# Patient Record
Sex: Female | Born: 1980 | Hispanic: Yes | Marital: Married | Smoking: Never smoker
Health system: Southern US, Community
[De-identification: ages and names within clinical notes are randomized; demographics above are authoritative.]

## PROBLEM LIST (undated history)

## (undated) ENCOUNTER — Inpatient Hospital Stay: Payer: Self-pay

## (undated) DIAGNOSIS — Z789 Other specified health status: Secondary | ICD-10-CM

## (undated) HISTORY — DX: Other specified health status: Z78.9

---

## 2017-01-13 ENCOUNTER — Observation Stay
Admission: EM | Admit: 2017-01-13 | Discharge: 2017-01-14 | Disposition: A | Discharge status: Home / Self Care | Payer: Medicaid Other | Attending: Obstetrics and Gynecology | Admitting: Obstetrics and Gynecology

## 2017-01-13 ENCOUNTER — Encounter: Payer: Self-pay | Admitting: Emergency Medicine

## 2017-01-13 DIAGNOSIS — I9589 Other hypotension: Secondary | ICD-10-CM

## 2017-01-13 DIAGNOSIS — N838 Other noninflammatory disorders of ovary, fallopian tube and broad ligament: Secondary | ICD-10-CM | POA: Diagnosis not present

## 2017-01-13 DIAGNOSIS — O00102 Left tubal pregnancy without intrauterine pregnancy: Principal | ICD-10-CM | POA: Insufficient documentation

## 2017-01-13 DIAGNOSIS — K661 Hemoperitoneum: Secondary | ICD-10-CM | POA: Diagnosis not present

## 2017-01-13 DIAGNOSIS — R55 Syncope and collapse: Secondary | ICD-10-CM

## 2017-01-13 DIAGNOSIS — Z9889 Other specified postprocedural states: Secondary | ICD-10-CM

## 2017-01-13 LAB — BASIC METABOLIC PANEL
Anion gap: 9 (ref 5–15)
BUN: 11 mg/dL (ref 6–20)
CHLORIDE: 102 mmol/L (ref 101–111)
CO2: 22 mmol/L (ref 22–32)
Calcium: 8.7 mg/dL — ABNORMAL LOW (ref 8.9–10.3)
Creatinine, Ser: 0.64 mg/dL (ref 0.44–1.00)
GFR calc Af Amer: >60 mL/min (ref >60)
GFR calc non Af Amer: >60 mL/min (ref >60)
GLUCOSE: 161 mg/dL — AB (ref 65–99)
POTASSIUM: 3.7 mmol/L (ref 3.5–5.1)
Sodium: 133 mmol/L — ABNORMAL LOW (ref 135–145)

## 2017-01-13 LAB — CBC
HEMATOCRIT: 37 % (ref 35.0–47.0)
Hemoglobin: 12.2 g/dL (ref 12.0–16.0)
MCH: 29.2 pg (ref 26.0–34.0)
MCHC: 33 g/dL (ref 32.0–36.0)
MCV: 88.4 fL (ref 80.0–100.0)
Platelets: 160 10*3/uL (ref 150–440)
RBC: 4.19 MIL/uL (ref 3.80–5.20)
RDW: 13.9 % (ref 11.5–14.5)
WBC: 13.6 10*3/uL — AB (ref 3.6–11.0)

## 2017-01-13 LAB — HCG, QUANTITATIVE, PREGNANCY: hCG, Beta Chain, Quant, S: 1130 m[IU]/mL — ABNORMAL HIGH (ref <5)

## 2017-01-13 LAB — POCT PREGNANCY, URINE: Preg Test, Ur: POSITIVE — AB

## 2017-01-13 LAB — LIPASE, BLOOD: Lipase: 32 U/L (ref 11–51)

## 2017-01-13 MED ORDER — SODIUM CHLORIDE 0.9 % IV BOLUS (SEPSIS)
1000.0000 mL | Freq: Once | INTRAVENOUS | Status: AC
  Administered 2017-01-13: 1000 mL via INTRAVENOUS

## 2017-01-13 NOTE — ED Provider Notes (Signed)
[facility] Emergency Department Provider Note   ____________________________________________   First MD Initiated Contact with Patient 01/13/17 2326     (approximate)  I have reviewed the triage vital signs and the nursing notes.   HISTORY  Chief Complaint Loss of Consciousness and Abdominal Pain  History obtained via Spanish interpreter  HPI Miranda Walsh is a 36 y.o. female who presents to the ED from home with a chief complaint of abdominal pain and syncope. Patient reports lower abdominal pain approximately 7:30 PM. States she was changing clothes when she became lightheaded, dizzy with blurry vision and passed out approximately 10:30 PM. Patient's spouse caught her so she did not suffer any injuries at that time. Spouse reports loss of consciousness for 5-6 seconds. Patient reports last menstrual period over one month ago. Denies recent fever, chills, chest pain, shortness of breath, nausea, vomiting, dysuria, diarrhea. Denies recent travel trauma. Nothing makes her symptoms better or worse.   Past medical history None  Patient Active Problem List   Diagnosis Date Noted  . S/P laparoscopic surgery 01/14/2017    Past Surgical History:  Procedure Laterality Date  . NO PAST SURGERIES      Prior to Admission medications   Not on File    Allergies Patient has no known allergies.  Family History  Problem Relation Age of Onset  . Cancer Neg Hx   . Hypertension Neg Hx   . Thyroid disease Neg Hx     Social History Social History  Substance Use Topics  . Smoking status: Never Smoker  . Smokeless tobacco: Never Used  . Alcohol use No    Review of Systems  Constitutional: No fever/chills. Eyes: No visual changes. ENT: No sore throat. Cardiovascular: Denies chest pain. Respiratory: Denies shortness of breath. Gastrointestinal: Positive for abdominal pain.  No nausea, no vomiting.  No diarrhea.  No  constipation. Genitourinary: Negative for dysuria. Musculoskeletal: Negative for back pain. Skin: Negative for rash. Neurological: Negative for headaches, focal weakness or numbness.  10-point ROS otherwise negative.  ____________________________________________   PHYSICAL EXAM:  VITAL SIGNS: ED Triage Vitals  Enc Vitals Group     BP 01/13/17 2257 (!) 90/55     Pulse Rate 01/13/17 2257 82     Resp 01/13/17 2257 18     Temp 01/13/17 2257 97.7 F (36.5 C)     Temp Source 01/13/17 2257 Oral     SpO2 01/13/17 2257 99 %     Weight 01/13/17 2259 160 lb (72.6 kg)     Height 01/13/17 2259 5\' 5"  (1.651 m)     Head Circumference --      Peak Flow --      Pain Score 01/13/17 2300 7     Pain Loc --      Pain Edu? --      Excl. in GC? --     Constitutional: Alert and oriented. Well appearing and in mild acute distress. Eyes: Conjunctivae are normal. PERRL. EOMI. Head: Atraumatic. Nose: No congestion/rhinnorhea. Mouth/Throat: Mucous membranes are moist.  Oropharynx non-erythematous. Neck: No stridor.  No cervical spine tenderness to palpation. Cardiovascular: Normal rate, regular rhythm. Grossly normal heart sounds.  Good peripheral circulation. Respiratory: Normal respiratory effort.  No retractions. Lungs CTAB. Gastrointestinal: Soft and mildly tender to palpation suprapubic area without rebound or guarding. No distention. No abdominal bruits. No CVA tenderness. Musculoskeletal: No lower extremity tenderness nor edema.  No joint effusions. Neurologic:  Normal speech and language. No gross  focal neurologic deficits are appreciated.  Skin:  Skin is warm, dry and intact. No rash noted. Psychiatric: Mood and affect are normal. Speech and behavior are normal.  ____________________________________________   LABS (all labs ordered are listed, but only abnormal results are displayed)  Labs Reviewed  BASIC METABOLIC PANEL - Abnormal; Notable for the following:       Result Value    Sodium 133 (*)    Glucose, Bld 161 (*)    Calcium 8.7 (*)    All other components within normal limits  CBC - Abnormal; Notable for the following:    WBC 13.6 (*)    All other components within normal limits  URINALYSIS, COMPLETE (UACMP) WITH MICROSCOPIC - Abnormal; Notable for the following:    Color, Urine YELLOW (*)    APPearance HAZY (*)    Hgb urine dipstick SMALL (*)    Protein, ur 30 (*)    Bacteria, UA RARE (*)    Squamous Epithelial / LPF 0-5 (*)    All other components within normal limits  HCG, QUANTITATIVE, PREGNANCY - Abnormal; Notable for the following:    hCG, Beta Chain, Quant, S 1,130 (*)    All other components within normal limits  POCT PREGNANCY, URINE - Abnormal; Notable for the following:    Preg Test, Ur POSITIVE (*)    All other components within normal limits  LIPASE, BLOOD  CBG MONITORING, ED  TYPE AND SCREEN  ABO/RH  SURGICAL PATHOLOGY   ____________________________________________  EKG  ED ECG REPORT I, Dinnis Rog J, the attending physician, personally viewed and interpreted this ECG.   Date: 01/13/2017  EKG Time: 2312  Rate: 83  Rhythm: normal EKG, normal sinus rhythm  Axis: Normal  Intervals:none  ST&T Change: Nonspecific  ____________________________________________  RADIOLOGY  OB US discussed with Dr. Andria Meuse: No intrauterine pregnancy demonstrated. No definitive identification  of an ectopic pregnancy. However, there is a large amount of blood  in the pelvis with fluid extending up into the abdomen. This is  highly suspicious for an occult ruptured ectopic pregnancy.   ____________________________________________   PROCEDURES  Procedure(s) performed: None  Procedures  Critical Care performed: Yes, see critical care note(s)   CRITICAL CARE Performed by: Irean Hong   Total critical care time: 45 minutes  Critical care time was exclusive of separately billable procedures and treating other patients.  Critical care  was necessary to treat or prevent imminent or life-threatening deterioration.  Critical care was time spent personally by me on the following activities: development of treatment plan with patient and/or surrogate as well as nursing, discussions with consultants, evaluation of patient's response to treatment, examination of patient, obtaining history from patient or surrogate, ordering and performing treatments and interventions, ordering and review of laboratory studies, ordering and review of radiographic studies, pulse oximetry and re-evaluation of patient's condition.  ____________________________________________   INITIAL IMPRESSION / ASSESSMENT AND PLAN / ED COURSE  Pertinent labs & imaging results that were available during my care of the patient were reviewed by me and considered in my medical decision making (see chart for details).  36 year old female who presents status post syncopal episode and lower abdominal pain. Urine POCT is positive for pregnancy; will send for urgent pelvic ultrasound to evaluate for ectopic pregnancy. Blood pressure improved on IV fluids.  Clinical Course as of Jan 14 538  Wynelle Link Jan 14, 2017  0204 Discussed with GYN on call Dr. Valentino Saxon who will evaluate patient in the emergency department  [JS]  0212  Updated patient and her husband on ultrasound results and need for emergent surgery. They verbalized understanding of the above discussion and are agreeable with plan of care.  [JS]  0212 2 units PRBCs on hold from the blood bank.  [JS]    Clinical Course User Index [JS] Irean Hong, MD     ____________________________________________   FINAL CLINICAL IMPRESSION(S) / ED DIAGNOSES  Final diagnoses:  Syncope, unspecified syncope type  Ruptured left tubal ectopic pregnancy causing hemoperitoneum  Hypotension due to blood loss      NEW MEDICATIONS STARTED DURING THIS VISIT:  There are no discharge medications for this patient.    Note:  This  document was prepared using Dragon voice recognition software and may include unintentional dictation errors.    Irean Hong, MD 01/14/17 [phone]

## 2017-01-13 NOTE — ED Triage Notes (Signed)
Pt states that she passed out at around 2230 this evening. Pt denies injury at the time due to her husband catching her. Pt reports that she started having lower abdominal pain at around 1930 this evening. Pt denies vaginal bleeding or discharge at this time. Pt is in NAD at this time.

## 2017-01-14 ENCOUNTER — Emergency Department: Payer: Medicaid Other | Admitting: Anesthesiology

## 2017-01-14 ENCOUNTER — Encounter: Payer: Self-pay | Admitting: Radiology

## 2017-01-14 ENCOUNTER — Emergency Department: Payer: Medicaid Other

## 2017-01-14 ENCOUNTER — Other Ambulatory Visit: Payer: Self-pay

## 2017-01-14 ENCOUNTER — Encounter
Admission: EM | Disposition: A | Discharge status: Home / Self Care | Payer: Self-pay | Source: Home / Self Care | Attending: Emergency Medicine

## 2017-01-14 DIAGNOSIS — O0889 Other complications following an ectopic and molar pregnancy: Secondary | ICD-10-CM | POA: Diagnosis not present

## 2017-01-14 DIAGNOSIS — O00102 Left tubal pregnancy without intrauterine pregnancy: Secondary | ICD-10-CM

## 2017-01-14 DIAGNOSIS — Z9889 Other specified postprocedural states: Secondary | ICD-10-CM

## 2017-01-14 HISTORY — PX: DIAGNOSTIC LAPAROSCOPY WITH REMOVAL OF ECTOPIC PREGNANCY: SHX6449

## 2017-01-14 LAB — TYPE AND SCREEN
BLOOD PRODUCT EXPIRATION DATE: 201803022359
Blood Product Expiration Date: 201803012359
Unit Type and Rh: 5100
Unit Type and Rh: 5100

## 2017-01-14 LAB — ABO/RH: ABO/RH(D): A POS

## 2017-01-14 LAB — URINALYSIS, COMPLETE (UACMP) WITH MICROSCOPIC
Bilirubin Urine: NEGATIVE
GLUCOSE, UA: NEGATIVE mg/dL
Ketones, ur: NEGATIVE mg/dL
Leukocytes, UA: NEGATIVE
NITRITE: NEGATIVE
Protein, ur: 30 mg/dL — AB
SPECIFIC GRAVITY, URINE: 1.021 (ref 1.005–1.030)
pH: 5 (ref 5.0–8.0)

## 2017-01-14 LAB — HEMOGLOBIN AND HEMATOCRIT, BLOOD
HCT: 26.8 % — ABNORMAL LOW (ref 35.0–47.0)
HEMOGLOBIN: 9.1 g/dL — AB (ref 12.0–16.0)

## 2017-01-14 SURGERY — LAPAROSCOPY, WITH ECTOPIC PREGNANCY SURGICAL TREATMENT
Anesthesia: General | Site: Abdomen | Laterality: Left | Wound class: Clean Contaminated

## 2017-01-14 MED ORDER — FERROUS SULFATE 325 (65 FE) MG PO TABS: 325.0000 mg | ORAL_TABLET | Freq: Every day | ORAL | 1 refills | Status: DC

## 2017-01-14 MED ORDER — MIDAZOLAM HCL 2 MG/2ML IJ SOLN
INTRAMUSCULAR | Status: DC | PRN
  Administered 2017-01-14: 2 mg via INTRAVENOUS

## 2017-01-14 MED ORDER — SUCCINYLCHOLINE CHLORIDE 20 MG/ML IJ SOLN
INTRAMUSCULAR | Status: DC | PRN
  Administered 2017-01-14: 100 mg via INTRAVENOUS

## 2017-01-14 MED ORDER — IBUPROFEN 600 MG PO TABS
600.0000 mg | ORAL_TABLET | Freq: Four times a day (QID) | ORAL | Status: DC | PRN
  Administered 2017-01-14: 600 mg via ORAL
  Filled 2017-01-14: qty 1

## 2017-01-14 MED ORDER — ACETAMINOPHEN 10 MG/ML IV SOLN
INTRAVENOUS | Status: DC | PRN
  Administered 2017-01-14: 1000 mg via INTRAVENOUS

## 2017-01-14 MED ORDER — LACTATED RINGERS IV SOLN
INTRAVENOUS | Status: DC
  Administered 2017-01-14: 11:00:00 via INTRAVENOUS

## 2017-01-14 MED ORDER — FENTANYL CITRATE (PF) 100 MCG/2ML IJ SOLN
INTRAMUSCULAR | Status: AC
  Administered 2017-01-14: 25 ug via INTRAVENOUS
  Filled 2017-01-14: qty 2

## 2017-01-14 MED ORDER — LACTATED RINGERS IV SOLN
INTRAVENOUS | Status: DC | PRN
  Administered 2017-01-14: 03:00:00 via INTRAVENOUS

## 2017-01-14 MED ORDER — FENTANYL CITRATE (PF) 100 MCG/2ML IJ SOLN
INTRAMUSCULAR | Status: AC
  Filled 2017-01-14: qty 2

## 2017-01-14 MED ORDER — DOCUSATE SODIUM 100 MG PO CAPS: 100.0000 mg | ORAL_CAPSULE | Freq: Two times a day (BID) | ORAL | 2 refills | Status: DC

## 2017-01-14 MED ORDER — ZOLPIDEM TARTRATE 5 MG PO TABS: 5.0000 mg | ORAL_TABLET | Freq: Every evening | ORAL | Status: DC | PRN

## 2017-01-14 MED ORDER — SUGAMMADEX SODIUM 200 MG/2ML IV SOLN
INTRAVENOUS | Status: DC | PRN
  Administered 2017-01-14: 200 mg via INTRAVENOUS

## 2017-01-14 MED ORDER — MIDAZOLAM HCL 2 MG/2ML IJ SOLN
INTRAMUSCULAR | Status: AC
  Filled 2017-01-14: qty 2

## 2017-01-14 MED ORDER — ONDANSETRON HCL 4 MG/2ML IJ SOLN: 4.0000 mg | Freq: Once | INTRAMUSCULAR | Status: DC | PRN

## 2017-01-14 MED ORDER — ONDANSETRON HCL 4 MG/2ML IJ SOLN
INTRAMUSCULAR | Status: DC | PRN
  Administered 2017-01-14: 4 mg via INTRAVENOUS

## 2017-01-14 MED ORDER — PROPOFOL 10 MG/ML IV BOLUS
INTRAVENOUS | Status: DC | PRN
  Administered 2017-01-14: 140 mg via INTRAVENOUS

## 2017-01-14 MED ORDER — KETOROLAC TROMETHAMINE 30 MG/ML IJ SOLN: 30.0000 mg | Freq: Once | INTRAMUSCULAR | Status: DC

## 2017-01-14 MED ORDER — OXYCODONE-ACETAMINOPHEN 5-325 MG PO TABS: 1.0000 | ORAL_TABLET | Freq: Four times a day (QID) | ORAL | 0 refills | Status: DC | PRN

## 2017-01-14 MED ORDER — FENTANYL CITRATE (PF) 100 MCG/2ML IJ SOLN
INTRAMUSCULAR | Status: DC | PRN
  Administered 2017-01-14: 100 ug via INTRAVENOUS

## 2017-01-14 MED ORDER — MAGNESIUM CITRATE PO SOLN: 1.0000 | Freq: Once | ORAL | Status: DC | PRN

## 2017-01-14 MED ORDER — MENTHOL 3 MG MT LOZG
1.0000 | LOZENGE | OROMUCOSAL | Status: DC | PRN
  Filled 2017-01-14: qty 9

## 2017-01-14 MED ORDER — ONDANSETRON HCL 4 MG PO TABS: 4.0000 mg | ORAL_TABLET | Freq: Four times a day (QID) | ORAL | Status: DC | PRN

## 2017-01-14 MED ORDER — FENTANYL CITRATE (PF) 100 MCG/2ML IJ SOLN
25.0000 ug | INTRAMUSCULAR | Status: DC | PRN
  Administered 2017-01-14 (×4): 25 ug via INTRAVENOUS

## 2017-01-14 MED ORDER — BUPIVACAINE HCL 0.5 % IJ SOLN
INTRAMUSCULAR | Status: DC | PRN
  Administered 2017-01-14: 13 mL

## 2017-01-14 MED ORDER — LIDOCAINE HCL (CARDIAC) 20 MG/ML IV SOLN
INTRAVENOUS | Status: DC | PRN
  Administered 2017-01-14: 50 mg via INTRAVENOUS

## 2017-01-14 MED ORDER — SODIUM CHLORIDE 0.9 % IV BOLUS (SEPSIS)
1000.0000 mL | Freq: Once | INTRAVENOUS | Status: AC
  Administered 2017-01-14: 1000 mL via INTRAVENOUS

## 2017-01-14 MED ORDER — IBUPROFEN 600 MG PO TABS: 600.0000 mg | ORAL_TABLET | Freq: Four times a day (QID) | ORAL | 1 refills | Status: DC | PRN

## 2017-01-14 MED ORDER — ACETAMINOPHEN 10 MG/ML IV SOLN
INTRAVENOUS | Status: AC
  Filled 2017-01-14: qty 100

## 2017-01-14 MED ORDER — SIMETHICONE 80 MG PO CHEW: 80.0000 mg | CHEWABLE_TABLET | Freq: Four times a day (QID) | ORAL | 1 refills | Status: DC | PRN

## 2017-01-14 MED ORDER — KETOROLAC TROMETHAMINE 30 MG/ML IJ SOLN
INTRAMUSCULAR | Status: DC | PRN
  Administered 2017-01-14: 30 mg via INTRAVENOUS

## 2017-01-14 MED ORDER — ALUM & MAG HYDROXIDE-SIMETH 200-200-20 MG/5ML PO SUSP: 30.0000 mL | ORAL | Status: DC | PRN

## 2017-01-14 MED ORDER — DOCUSATE SODIUM 100 MG PO CAPS
100.0000 mg | ORAL_CAPSULE | Freq: Two times a day (BID) | ORAL | Status: DC
  Administered 2017-01-14: 100 mg via ORAL
  Filled 2017-01-14: qty 1

## 2017-01-14 MED ORDER — ONDANSETRON HCL 4 MG/2ML IJ SOLN: 4.0000 mg | Freq: Four times a day (QID) | INTRAMUSCULAR | Status: DC | PRN

## 2017-01-14 MED ORDER — SIMETHICONE 80 MG PO CHEW: 80.0000 mg | CHEWABLE_TABLET | Freq: Four times a day (QID) | ORAL | Status: DC | PRN

## 2017-01-14 MED ORDER — PANTOPRAZOLE SODIUM 40 MG PO TBEC
40.0000 mg | DELAYED_RELEASE_TABLET | Freq: Every day | ORAL | Status: DC
  Administered 2017-01-14: 40 mg via ORAL
  Filled 2017-01-14: qty 1

## 2017-01-14 MED ORDER — BISACODYL 10 MG RE SUPP: 10.0000 mg | Freq: Every day | RECTAL | Status: DC | PRN

## 2017-01-14 MED ORDER — OXYCODONE-ACETAMINOPHEN 5-325 MG PO TABS: 1.0000 | ORAL_TABLET | ORAL | Status: DC | PRN

## 2017-01-14 MED ORDER — SENNOSIDES-DOCUSATE SODIUM 8.6-50 MG PO TABS: 1.0000 | ORAL_TABLET | Freq: Every evening | ORAL | Status: DC | PRN

## 2017-01-14 MED ORDER — HYDROMORPHONE HCL 1 MG/ML IJ SOLN: 0.2000 mg | INTRAMUSCULAR | Status: DC | PRN

## 2017-01-14 SURGICAL SUPPLY — 37 items
APPLICATOR COTTON TIP 6IN STRL (MISCELLANEOUS) ×3 IMPLANT
BLADE SURG SZ11 CARB STEEL (BLADE) ×3 IMPLANT
CANISTER SUCT 1200ML W/VALVE (MISCELLANEOUS) ×3 IMPLANT
CATH ROBINSON RED A/P 16FR (CATHETERS) ×3 IMPLANT
CHLORAPREP W/TINT 26ML (MISCELLANEOUS) ×3 IMPLANT
CORD MONOPOLAR M/FML 12FT (MISCELLANEOUS) IMPLANT
GLOVE BIO SURGEON STRL SZ 6.5 (GLOVE) ×2 IMPLANT
GLOVE BIO SURGEON STRL SZ8 (GLOVE) ×3 IMPLANT
GLOVE BIO SURGEONS STRL SZ 6.5 (GLOVE) ×1
GLOVE INDICATOR 7.0 STRL GRN (GLOVE) ×3 IMPLANT
GLOVE INDICATOR 8.0 STRL GRN (GLOVE) ×3 IMPLANT
GOWN STRL REUS W/ TWL LRG LVL3 (GOWN DISPOSABLE) ×2 IMPLANT
GOWN STRL REUS W/TWL LRG LVL3 (GOWN DISPOSABLE) ×4
GOWN STRL REUS W/TWL XL LVL3 (GOWN DISPOSABLE) ×3 IMPLANT
GRASPER SUT TROCAR 14GX15 (MISCELLANEOUS) ×3 IMPLANT
IRRIGATION STRYKERFLOW (MISCELLANEOUS) ×1 IMPLANT
IRRIGATOR STRYKERFLOW (MISCELLANEOUS) ×3
IV LACTATED RINGERS 1000ML (IV SOLUTION) ×3 IMPLANT
KIT PINK PAD W/HEAD ARE REST (MISCELLANEOUS) ×3
KIT PINK PAD W/HEAD ARM REST (MISCELLANEOUS) ×1 IMPLANT
KIT RM TURNOVER CYSTO AR (KITS) ×3 IMPLANT
LIQUID BAND (GAUZE/BANDAGES/DRESSINGS) ×3 IMPLANT
NS IRRIG 500ML POUR BTL (IV SOLUTION) ×3 IMPLANT
PACK GYN LAPAROSCOPIC (MISCELLANEOUS) ×3 IMPLANT
PAD OB MATERNITY 4.3X12.25 (PERSONAL CARE ITEMS) ×3 IMPLANT
PAD PREP 24X41 OB/GYN DISP (PERSONAL CARE ITEMS) ×3 IMPLANT
POUCH ENDO CATCH 10MM SPEC (MISCELLANEOUS) ×3 IMPLANT
SCISSORS METZENBAUM CVD 33 (INSTRUMENTS) IMPLANT
SHEARS HARMONIC ACE PLUS 36CM (ENDOMECHANICALS) ×3 IMPLANT
SLEEVE ENDOPATH XCEL 5M (ENDOMECHANICALS) ×6 IMPLANT
SUT VIC AB 3-0 SH 27 (SUTURE)
SUT VIC AB 3-0 SH 27X BRD (SUTURE) IMPLANT
SUT VICRYL 0 AB UR-6 (SUTURE) ×3 IMPLANT
TROCAR ENDO BLADELESS 11MM (ENDOMECHANICALS) ×3 IMPLANT
TROCAR XCEL NON-BLD 5MMX100MML (ENDOMECHANICALS) ×3 IMPLANT
TROCAR XCEL UNIV SLVE 11M 100M (ENDOMECHANICALS) ×3 IMPLANT
TUBING INSUFFLATOR HI FLOW (MISCELLANEOUS) ×3 IMPLANT

## 2017-01-14 NOTE — Op Note (Signed)
Procedure(s): DIAGNOSTIC LAPAROSCOPY WITH REMOVAL OF ECTOPIC PREGNANCY Procedure Note  Miranda Walsh female 36 y.o. 01/14/2017  Indications: The patient is a 36 y.o. G1P0 female with suspected ruptured ectopic pregnancy (side undetermined), hemoperitoneum  Pre-operative Diagnosis: Suspected ruptured ectopic pregnancy, hemoperitoneum  Post-operative Diagnosis: Left ruptured ectopic pregnancy, hemoperitoneum  Surgeon: Hildred Laser, MD  Assistants: Surgical Scrub Tech  Anesthesia: General endotracheal anesthesia   Procedure Details: The patient was seen in the Holding Room. The risks, benefits, complications, treatment options, and expected outcomes were discussed with the patient.  The patient concurred with the proposed plan, giving informed consent.  The site of surgery properly noted/marked. The patient was taken to the Operating Room, identified as Delbert Harness and the procedure verified as Procedure(s) (LRB): DIAGNOSTIC LAPAROSCOPY WITH REMOVAL OF ECTOPIC PREGNANCY (Left).  She was then placed under general anesthesia without difficulty. She was placed in the dorsal lithotomy position, and was prepped and draped in a sterile manner.  A straight catheterization was performed. A sterile speculum was inserted into the vagina and the cervix was grasped at the anterior lip using a single-toothed tenaculum.  The uterus was sounded to 8 cm, and a Hulka clamp was placed for uterine manipulation.  The speculum and tenaculum were then removed. After an adequate timeout was performed, attention was turned to the abdomen where an umbilical incision was made with the scalpel.  The Optiview 5-mm trocar and sleeve were then advanced without difficulty with the laparoscope under direct visualization into the abdomen.  The abdomen was then insufflated with carbon dioxide gas and adequate pneumoperitoneum was obtained. A 5-mm suprapubic port and an 11-mm right lower quadrant port  were then placed under direct visualization.  A survey of the patient's pelvis and abdomen revealed the findings as above. The hemoperitoneum was drained, with a total of 1450 ml of blood collected. Attention was then turned to the left fallopian tube which was grasped and ligated from the underlying mesosalpinx and uterine attachment using the Harmonic instrument.  Good hemostasis was noted.  The specimen was placed in an EndoCatch bag and removed from the abdomen intact.  The abdomen was desufflated, and all instruments were removed.  The fascial incision of the 10-mm site was reapproximated with a 0 Vicryl figure-of-eight stitch using the Carter-Thomason instrument for closure.   The pneumoperitoneum was then re-established, and the laparoscope was then introduced once again into the abdominal cavity.  A final survey was performed, where good hemostasis was noted on the right side.  Good hemostasis was maintained.   All trocars were removed under direct visualization, and the abdomen which was desufflated.    The right skin incision subcutaneous tissue was closed using a 3-0 Vicryl. All skin incisions were closed with Dermabond. The patient tolerated the procedures well.  All instruments, needles, and sponge counts were correct x 2. The patient was taken to the recovery room awake, extubated and in stable condition.   Findings: The uterus was sounded to 8 cm.  Large amount of hemoperitoneum estimated to be about 1450 ml of blood and clots.  Dilated left fallopian tube containing ectopic gestation as well as medium sized tubal cyst. Small normal appearing uterus, normal right fallopian tube, right ovary and left ovary. Defect in posterior cul-de-sac near right uterosacral ligament.   Estimated Blood Loss:        Drains: straight catheterization prior to procedure with 100 ml of clear urine         Total  IV Fluids:  1500 ml  Specimens: Left fallopian tube containing ectopic pregnancy and tubal  cyst.         Implants: None         Complications:  None; patient tolerated the procedure well.         Disposition: PACU - hemodynamically stable.         Condition: stable   Hildred LaserAnika Byron Tipping, MD [facility]

## 2017-01-14 NOTE — Anesthesia Preprocedure Evaluation (Signed)
Anesthesia Evaluation  Patient identified by MRN, date of birth, ID band Patient awake    Reviewed: Allergy & Precautions, NPO status , Patient's Chart, lab work & pertinent test results, reviewed documented beta blocker date and time   Airway Mallampati: II  TM Distance: >3 FB     Dental  (+) Chipped   Pulmonary           Cardiovascular      Neuro/Psych    GI/Hepatic   Endo/Other    Renal/GU      Musculoskeletal   Abdominal   Peds  Hematology   Anesthesia Other Findings   Reproductive/Obstetrics                             Anesthesia Physical Anesthesia Plan  ASA: II  Anesthesia Plan: General   Post-op Pain Management:    Induction: Intravenous and Rapid sequence  Airway Management Planned: Oral ETT  Additional Equipment:   Intra-op Plan:   Post-operative Plan:   Informed Consent: I have reviewed the patients History and Physical, chart, labs and discussed the procedure including the risks, benefits and alternatives for the proposed anesthesia with the patient or authorized representative who has indicated his/her understanding and acceptance.     Plan Discussed with: CRNA  Anesthesia Plan Comments:         Anesthesia Quick Evaluation  

## 2017-01-14 NOTE — Discharge Summary (Signed)
Gynecology Physician Postoperative Discharge Summary  Patient ID: Miranda Walsh MRN: [id number] DOB/AGE: 1981/02/11 36 y.o.  Admit Date: 01/13/2017 Discharge Date: 01/14/2017  Preoperative Diagnoses: Ruptured left ectopic pregnancy with hemoperitoneum  Procedures: Procedure(s) (LRB): DIAGNOSTIC LAPAROSCOPY WITH REMOVAL OF ECTOPIC PREGNANCY (Left)  Hospital Course:  Miranda Walsh is a 36 y.o. G1P0  admitted from the Emergency Room for the above mentioned emergency surgery.  She underwent the procedures as mentioned above, her operation was uncomplicated. For further details about surgery, please refer to the operative report. Patient had an uncomplicated postoperative course. By time of discharge on POD#0, her pain was controlled on oral pain medications; she was ambulating, voiding without difficulty, tolerating regular diet and passing flatus. She was deemed stable for discharge to home.   Significant Labs: CBC Latest Ref Rng & Units 01/14/2017 01/13/2017  WBC 3.6 - 11.0 K/uL - 13.6(H)  Hemoglobin 12.0 - 16.0 g/dL 0.8(M9.1(L) 57.812.2  Hematocrit 35.0 - 47.0 % 26.8(L) 37.0  Platelets 150 - 440 K/uL - 160    Discharge Exam: Blood pressure 117/71, pulse 93, temperature 98 F (36.7 C), temperature source Oral, resp. rate 16, height 5\' 5"  (1.651 m), weight 160 lb (72.6 kg), last menstrual period 12/12/2016, SpO2 100 %. General appearance: alert and no distress  Resp: clear to auscultation bilaterally  Cardio: regular rate and rhythm  GI: soft, non-tender; bowel sounds normal; no masses, no organomegaly.  Incision: C/D/I, no erythema, no drainage noted Pelvic: no bleeding Extremities: extremities normal, atraumatic, no cyanosis or edema and Homans sign is negative, no sign of DVT  Discharged Condition: Stable  Disposition: Home   Allergies as of 01/14/2017   No Known Allergies     Medication List    TAKE these medications   docusate sodium 100 MG  capsule Commonly known as:  COLACE Take 1 capsule (100 mg total) by mouth 2 (two) times daily.   ferrous sulfate 325 (65 FE) MG tablet Commonly known as:  FERROUSUL Take 1 tablet (325 mg total) by mouth daily with breakfast.   ibuprofen 600 MG tablet Commonly known as:  ADVIL,MOTRIN Take 1 tablet (600 mg total) by mouth every 6 (six) hours as needed (mild pain).   oxyCODONE-acetaminophen 5-325 MG tablet Commonly known as:  PERCOCET/ROXICET Take 1-2 tablets by mouth every 6 (six) hours as needed for severe pain (moderate to severe pain (when tolerating fluids)).   simethicone 80 MG chewable tablet Commonly known as:  MYLICON Chew 1 tablet (80 mg total) by mouth 4 (four) times daily as needed for flatulence.      Follow-up Information    Hildred LaserAnika Shawnell Dykes, MD In 1 week.   Specialties:  Obstetrics and Gynecology, Radiology Why:  For wound re-check Contact information: [address] [place] [address] [phone]           Signed:  Hildred LaserAnika Ranie Chinchilla, MD [facility]

## 2017-01-14 NOTE — ED Notes (Signed)
Dr. Valentino Saxonherry in to speak with pt. Interpreter yemul utilized via [employer] for consent.

## 2017-01-14 NOTE — Anesthesia Postprocedure Evaluation (Signed)
Anesthesia Post Note  Patient: Miranda Walsh  Procedure(s) Performed: Procedure(s) (LRB): DIAGNOSTIC LAPAROSCOPY WITH REMOVAL OF ECTOPIC PREGNANCY (Left)  Patient location during evaluation: PACU Anesthesia Type: General Level of consciousness: awake and alert Pain management: pain level controlled Vital Signs Assessment: post-procedure vital signs reviewed and stable Respiratory status: spontaneous breathing, nonlabored ventilation, respiratory function stable and patient connected to nasal cannula oxygen Cardiovascular status: blood pressure returned to baseline and stable Postop Assessment: no signs of nausea or vomiting Anesthetic complications: no     Last Vitals:  Vitals:   01/14/17 0723 01/14/17 1202  BP: 110/61 117/71  Pulse: 77 93  Resp: 14 16  Temp: 36.8 C 36.7 C    Last Pain:  Vitals:   01/14/17 1202  TempSrc: Oral  PainSc:                  Terika Pillard S

## 2017-01-14 NOTE — Consult Note (Addendum)
GYNECOLOGY CONSULT HISTORY AND PHYSICAL  Reason for Consult: suspected ruptured ectopic pregnancy Referring Physician: Lurline Hare, MD (ER Physician)  Miranda Walsh is an 36 y.o. G1P0 female with last menstrual period of 12/12/2016 who presented to the Emergency Room with complaints of abdominal pain and syncope.  Patient notes pain began at approximately 7:00 p.m.  She began to feel lightheaded and dizzy and had blurred vision while attempting to change clothes at approximately 10:30 p.m, with syncopal episode.   Did not fall or hit her head as her husband caught her. He reports her LOC lasted for only "a few seconds".  Denies fevers, chills, nausea/vomiting.   Pain is currently rated at an 8/10.   Administrator, sports via Pharmacist, hospital  [phone])  Pertinent Gynecological History: Menses: regular every month without intermenstrual spotting Contraception: none DES exposure: denies Blood transfusions: none Sexually transmitted diseases: no past history Previous GYN Procedures: none    Menstrual History: Menarche age: 13 Patient's last menstrual period was 12/12/2016.    History reviewed. No pertinent past medical history.  Past Surgical History:  Procedure Laterality Date  . NO PAST SURGERIES      Family History  Problem Relation Age of Onset  . Cancer Neg Hx   . Hypertension Neg Hx   . Thyroid disease Neg Hx     Social History   Social History  . Marital status: Married    Spouse name: N/A  . Number of children: N/A  . Years of education: N/A   Occupational History  . Not on file.   Social History Main Topics  . Smoking status: Never Smoker  . Smokeless tobacco: Never Used  . Alcohol use No  . Drug use: No  . Sexual activity: Not on file   Other Topics Concern  . Not on file   Social History Narrative  . No narrative on file    Allergies: No Known Allergies  No current facility-administered medications on file prior to encounter.    No  current outpatient prescriptions on file prior to encounter.     Review of Systems  Constitutional: Negative for chills and fever.  HENT: Negative for hearing loss, sore throat and tinnitus.   Eyes: Positive for blurred vision. Negative for double vision.  Respiratory: Negative for cough, shortness of breath and wheezing.   Cardiovascular: Negative for chest pain, palpitations and leg swelling.  Gastrointestinal: Positive for abdominal pain. Negative for heartburn, nausea and vomiting.       Generalized lower abdominal pain  Genitourinary: Negative for dysuria, flank pain, frequency, hematuria and urgency.  Musculoskeletal: Negative.   Skin: Negative for itching and rash.  Neurological: Positive for dizziness and loss of consciousness. Negative for tingling, weakness and headaches.  Endo/Heme/Allergies: Negative for polydipsia. Does not bruise/bleed easily.  Psychiatric/Behavioral: Negative for depression and substance abuse.    Blood pressure 104/69, pulse 83, temperature 97.7 F (36.5 C), temperature source Oral, resp. rate 15, height _0  (1.651 m), weight 160 lb (72.6 kg), last menstrual period 12/12/2016, SpO2 100 %. Physical Exam  Constitutional: She is oriented to person, place, and time. She appears well-developed and well-nourished. She appears distressed.  Mild distress  HENT:  Head: Normocephalic and atraumatic.  Right Ear: External ear normal.  Left Ear: External ear normal.  Nose: Nose normal.  Mouth/Throat: Oropharynx is clear and moist.  Eyes: Conjunctivae and EOM are normal. Pupils are equal, round, and reactive to light. No scleral icterus.  Neck: Normal range of  motion. Neck supple. No tracheal deviation present. No thyromegaly present.  Cardiovascular: Normal rate, regular rhythm and normal heart sounds.  Exam reveals no gallop and no friction rub.   No murmur heard. Respiratory: Effort normal and breath sounds normal. No respiratory distress. She has no  wheezes. She exhibits no tenderness.  GI: Soft. Bowel sounds are normal. She exhibits no distension. There is tenderness. There is no rebound and no guarding.  Moderate tenderness in lower abdomen.   Genitourinary: Vagina normal and uterus normal. No vaginal discharge found.  Genitourinary Comments: Cervix normal.  Difficult to palpate uterus due to patient's discomfort, but feels ~ 6-7 week size.   Musculoskeletal: Normal range of motion. She exhibits no edema, tenderness or deformity.  Neurological: She is alert and oriented to person, place, and time. She has normal reflexes.  Skin: Skin is warm and dry. She is not diaphoretic.  Psychiatric: She has a normal mood and affect. Her behavior is normal. Thought content normal.    Results for orders placed or performed during the hospital encounter of 01/13/17 (from the past 48 hour(s))  Basic metabolic panel     Status: Abnormal   Collection Time: 01/13/17 11:16 PM  Result Value Ref Range   Sodium 133 (L) 135 - 145 mmol/L   Potassium 3.7 3.5 - 5.1 mmol/L   Chloride 102 101 - 111 mmol/L   CO2 22 22 - 32 mmol/L   Glucose, Bld 161 (H) 65 - 99 mg/dL   BUN 11 6 - 20 mg/dL   Creatinine, Ser 0.64 0.44 - 1.00 mg/dL   Calcium 8.7 (L) 8.9 - 10.3 mg/dL   GFR calc non Af Amer >60 >60 mL/min   GFR calc Af Amer >60 >60 mL/min    Comment: (NOTE) The eGFR has been calculated using the CKD EPI equation. This calculation has not been validated in all clinical situations. eGFR's persistently <60 mL/min signify possible Chronic Kidney Disease.    Anion gap 9 5 - 15  CBC     Status: Abnormal   Collection Time: 01/13/17 11:16 PM  Result Value Ref Range   WBC 13.6 (H) 3.6 - 11.0 K/uL   RBC 4.19 3.80 - 5.20 MIL/uL   Hemoglobin 12.2 12.0 - 16.0 g/dL   HCT 37.0 35.0 - 47.0 %   MCV 88.4 80.0 - 100.0 fL   MCH 29.2 26.0 - 34.0 pg   MCHC 33.0 32.0 - 36.0 g/dL   RDW 13.9 11.5 - 14.5 %   Platelets 160 150 - 440 K/uL  Lipase, blood     Status: None    Collection Time: 01/13/17 11:16 PM  Result Value Ref Range   Lipase 32 11 - 51 U/L  hCG, quantitative, pregnancy     Status: Abnormal   Collection Time: 01/13/17 11:16 PM  Result Value Ref Range   hCG, Beta Chain, Quant, S 1,130 (H) <5 mIU/mL    Comment:          GEST. AGE      CONC.  (mIU/mL)   <=1 WEEK        5 - 50     2 WEEKS       50 - 500     3 WEEKS       100 - 10,000     4 WEEKS     1,000 - 30,000     5 WEEKS     3,500 - 115,000   6-8 WEEKS     12,000 -  270,000    12 WEEKS     15,000 - 220,000        FEMALE AND NON-PREGNANT FEMALE:     LESS THAN 5 mIU/mL   ABO/Rh     Status: None   Collection Time: 01/13/17 11:16 PM  Result Value Ref Range   ABO/RH(D) A POS   Urinalysis, Complete w Microscopic     Status: Abnormal   Collection Time: 01/13/17 11:41 PM  Result Value Ref Range   Color, Urine YELLOW (A) YELLOW   APPearance HAZY (A) CLEAR   Specific Gravity, Urine 1.021 1.005 - 1.030   pH 5.0 5.0 - 8.0   Glucose, UA NEGATIVE NEGATIVE mg/dL   Hgb urine dipstick SMALL (A) NEGATIVE   Bilirubin Urine NEGATIVE NEGATIVE   Ketones, ur NEGATIVE NEGATIVE mg/dL   Protein, ur 30 (A) NEGATIVE mg/dL   Nitrite NEGATIVE NEGATIVE   Leukocytes, UA NEGATIVE NEGATIVE   RBC / HPF 0-5 0 - 5 RBC/hpf   WBC, UA 0-5 0 - 5 WBC/hpf   Bacteria, UA RARE (A) NONE SEEN   Squamous Epithelial / LPF 0-5 (A) NONE SEEN   Mucous PRESENT    Hyaline Casts, UA PRESENT   Type and screen [facility]     Status: None (Preliminary result)   Collection Time: 01/13/17 11:41 PM  Result Value Ref Range   Blood Product Unit Number [id number]    Unit Type and Rh 5100    Blood Product Expiration Date [id number]    Blood Product Unit Number [id number]    Unit Type and Rh 5100    Blood Product Expiration Date [id number]   Pregnancy, urine POC     Status: Abnormal   Collection Time: 01/13/17 11:57 PM  Result Value Ref Range   Preg Test, Ur POSITIVE (A) NEGATIVE    Comment:         THE SENSITIVITY OF THIS METHODOLOGY IS >24 mIU/mL     US Ob Comp Less 14 Wks  Result Date: 01/14/2017 CLINICAL DATA:  Lower abdominal pain for 5 hours. Fainting episode 2 hours ago. Estimated gestational age by LMP is 4 weeks 5 days. Quantitative beta HCG is 1,130 EXAM: OBSTETRIC <14 WK [place] AND TRANSVAGINAL OB US TECHNIQUE: Both transabdominal and transvaginal ultrasound examinations were performed for complete evaluation of the gestation as well as the maternal uterus, adnexal regions, and pelvic cul-de-sac. Transvaginal technique was performed to assess early pregnancy. COMPARISON:  None. FINDINGS: Intrauterine gestational sac: None Yolk sac:  Not Visualized. Embryo:  Not Visualized. Cardiac Activity: Not Visualized. Maternal uterus/adnexae: Uterus is retroverted. No myometrial mass lesions are identified. Endometrium is not thickened. No endometrial fluid collections. Left ovary is visualized and contains normal appearing follicular changes with a dominant hemorrhagic cyst measuring about 1.5 cm diameter. Right ovary is visualized and shows follicular change. There is a large amount of free fluid in the pelvis with heterogeneous appearance consistent with blood. Fluid extends throughout the pelvis and into the right upper quadrant. Images show fluid around the liver and in Morison's pouch. No definitive ectopic pregnancy is visualized, but the large amount of hemorrhage is highly suspicious for a ruptured ectopic pregnancy. IMPRESSION: No intrauterine pregnancy demonstrated. No definitive identification of an ectopic pregnancy. However, there is a large amount of blood in the pelvis with fluid extending up into the abdomen. This is highly suspicious for an occult ruptured ectopic pregnancy. These results were called by telephone at the time of interpretation on 01/14/2017 at 1:55  am to Dr. Luvenia Starch SUNG , who verbally acknowledged these results. Electronically Signed   By: Lucienne Capers M.D.   On:  01/14/2017 02:01   US Ob Transvaginal  Result Date: 01/14/2017 CLINICAL DATA:  Lower abdominal pain for 5 hours. Fainting episode 2 hours ago. Estimated gestational age by LMP is 4 weeks 5 days. Quantitative beta HCG is 1,130 EXAM: OBSTETRIC <14 WK [place] AND TRANSVAGINAL OB US TECHNIQUE: Both transabdominal and transvaginal ultrasound examinations were performed for complete evaluation of the gestation as well as the maternal uterus, adnexal regions, and pelvic cul-de-sac. Transvaginal technique was performed to assess early pregnancy. COMPARISON:  None. FINDINGS: Intrauterine gestational sac: None Yolk sac:  Not Visualized. Embryo:  Not Visualized. Cardiac Activity: Not Visualized. Maternal uterus/adnexae: Uterus is retroverted. No myometrial mass lesions are identified. Endometrium is not thickened. No endometrial fluid collections. Left ovary is visualized and contains normal appearing follicular changes with a dominant hemorrhagic cyst measuring about 1.5 cm diameter. Right ovary is visualized and shows follicular change. There is a large amount of free fluid in the pelvis with heterogeneous appearance consistent with blood. Fluid extends throughout the pelvis and into the right upper quadrant. Images show fluid around the liver and in Morison's pouch. No definitive ectopic pregnancy is visualized, but the large amount of hemorrhage is highly suspicious for a ruptured ectopic pregnancy. IMPRESSION: No intrauterine pregnancy demonstrated. No definitive identification of an ectopic pregnancy. However, there is a large amount of blood in the pelvis with fluid extending up into the abdomen. This is highly suspicious for an occult ruptured ectopic pregnancy. These results were called by telephone at the time of interpretation on 01/14/2017 at 1:55 am to Dr. Lurline Walsh , who verbally acknowledged these results. Electronically Signed   By: Lucienne Capers M.D.   On: 01/14/2017 02:01    Assessment/Plan:  1.  Suspected ruptured ectopic pregnancy with hemoperitoneum- No IUP noted in uterus, (however BHCG 1130, may not be able to adequately visualize a fetal pole at this level), however patient with significant hemoperitoneum. The patient was counseled regarding her options.  Based on findings, surgical intervention with diagnostic laparoscopy and unilateral salpingectomy are warranted. She was offered laparoscopy as the definitive way of both diagnosing and treating her ectopic pregnancy.  The risks of laparoscopy were discussed including bleeding, infection, damage to surrounding organs, need for further surgery.  The risks of surgery were discussed in detail with the patient including but not limited to: bleeding which may require transfusion or reoperation; infection which may require prolonged hospitalization or re-hospitalization and antibiotic therapy; injury to bowel, bladder, ureters and major vessels or other surrounding organs; need for additional procedures including laparotomy; thromboembolic phenomenon, incisional problems and other postoperative or anesthesia complications.  Patient was told that the likelihood that her condition and symptoms will be treated effectively with this surgical management was very high; the postoperative expectations were also discussed in detail.    Rubie Maid, MD [facility]

## 2017-01-14 NOTE — ED Notes (Signed)
Pt with mid lower pelvic pain that began around 1900 last pm. Pt states she was getting ready to come to ed because pain was severe, when she had a syncopal episode at home. Pt's spouse was able to catch her and she did not suffer head trauma with syncope. Information obtained with rafeal interpreter.

## 2017-01-14 NOTE — ED Notes (Signed)
Pt returned from ultrasound

## 2017-01-14 NOTE — ED Notes (Signed)
Patient transported to Ultrasound 

## 2017-01-14 NOTE — ED Notes (Signed)
Pt assisted with bedpan for urination. Pt continues to complain of pain 7/10. Skin slightly pale, but dry. vss currently.

## 2017-01-14 NOTE — Anesthesia Procedure Notes (Signed)
Procedure Name: Intubation Date/Time: 01/14/2017 3:30 AM Performed by: [id number], Ibrohim Simmers Pre-anesthesia Checklist: Timeout performed, Patient being monitored, Suction available, Emergency Drugs available and Patient identified Patient Re-evaluated:Patient Re-evaluated prior to inductionOxygen Delivery Method: Circle system utilized Intubation Type: IV induction, Rapid sequence and Cricoid Pressure applied Laryngoscope Size: Mac and 3 Grade View: Grade I Tube type: Oral Tube size: 7.0 mm Number of attempts: 1 Airway Equipment and Method: Stylet Secured at: 22 cm Tube secured with: Tape

## 2017-01-14 NOTE — Anesthesia Post-op Follow-up Note (Cosign Needed)
Anesthesia QCDR form completed.        

## 2017-01-14 NOTE — Progress Notes (Signed)
Discharge instructions complete with interpreter and prescriptions given. Patient verbalizes understanding of teaching. Patient discharged home at 1625.

## 2017-01-14 NOTE — ED Notes (Signed)
Pt to or, report to melody in or.

## 2017-01-14 NOTE — ED Notes (Signed)
Pt updated on progression of treatment plan.

## 2017-01-14 NOTE — Transfer of Care (Signed)
Immediate Anesthesia Transfer of Care Note  Patient: Miranda Walsh  Procedure(s) Performed: Procedure(s): DIAGNOSTIC LAPAROSCOPY WITH REMOVAL OF ECTOPIC PREGNANCY (Left)  Patient Location: PACU  Anesthesia Type:General  Level of Consciousness: awake and alert   Airway & Oxygen Therapy: Patient Spontanous Breathing and Patient connected to face mask oxygen  Post-op Assessment: Report given to RN  Post vital signs: Reviewed and stable  Last Vitals:  Vitals:   01/14/17 0130 01/14/17 0230  BP: 104/69 97/61  Pulse: 83   Resp: 15   Temp:      Last Pain:  Vitals:   01/13/17 2300  TempSrc:   PainSc: 7          Complications: No apparent anesthesia complications

## 2017-01-14 NOTE — Progress Notes (Signed)
Day of Surgery Procedure(s) (LRB): DIAGNOSTIC LAPAROSCOPY WITH REMOVAL OF ECTOPIC PREGNANCY (Left)  Subjective: Patient reports tolerating PO and no problems voiding.  Denies nausea and vomiting.     Objective: I have reviewed patient's vital signs, intake and output, medications and labs.  Temp:  [97.5 F (36.4 C)-98.6 F (37 C)] 98.3 F (36.8 C) (02/18 0723) Pulse Rate:  [74-91] 77 (02/18 0723) Resp:  [13-22] 14 (02/18 0723) BP: (90-123)/(55-79) 110/61 (02/18 0723) SpO2:  [99 %-100 %] 100 % (02/18 0723) Weight:  [160 lb (72.6 kg)] 160 lb (72.6 kg) (02/17 2259)  General: alert and no distress Resp: clear to auscultation bilaterally Cardio: regular rate and rhythm, S1, S2 normal, no murmur, click, rub or gallop GI: soft, non-tender; bowel sounds normal; no masses,  no organomegaly Extremities: extremities normal, atraumatic, no cyanosis or edema Vaginal Bleeding: none Incision: laparoscopic port sites c/d/i with Dermabond.     Labs:  CBC Latest Ref Rng & Units 01/14/2017 01/13/2017  WBC 3.6 - 11.0 K/uL - 13.6(H)  Hemoglobin 12.0 - 16.0 g/dL 4.0(J9.1(L) 81.112.2  Hematocrit 35.0 - 47.0 % 26.8(L) 37.0  Platelets 150 - 440 K/uL - 160    Assessment: s/p Procedure(s): DIAGNOSTIC LAPAROSCOPY WITH REMOVAL OF ECTOPIC PREGNANCY (Left): stable, progressing well and tolerating diet,   Plan: Regular diet Advance to PO pain management Anemia, secondary to hemoperitoneum at time of surgery (1450 ml).  Asymptomatic.  Will treat with PO iron.  Encourage ambulation Discontinue IV fluids Discharge home     LOS: 0 days    Miranda Walsh Miranda Walsh 01/14/2017, 12:08 PM

## 2017-01-15 ENCOUNTER — Encounter: Payer: Self-pay | Admitting: Obstetrics and Gynecology

## 2017-01-16 LAB — SURGICAL PATHOLOGY

## 2017-01-17 ENCOUNTER — Encounter: Payer: Self-pay | Admitting: Obstetrics and Gynecology

## 2017-01-23 ENCOUNTER — Other Ambulatory Visit: Payer: Self-pay | Admitting: Obstetrics and Gynecology

## 2017-01-23 ENCOUNTER — Encounter: Payer: Self-pay | Admitting: Obstetrics and Gynecology

## 2017-01-23 ENCOUNTER — Ambulatory Visit (INDEPENDENT_AMBULATORY_CARE_PROVIDER_SITE_OTHER): Payer: Self-pay | Admitting: Obstetrics and Gynecology

## 2017-01-23 VITALS — BP 115/77 | HR 81 | Wt 171.7 lb

## 2017-01-23 DIAGNOSIS — K5900 Constipation, unspecified: Secondary | ICD-10-CM

## 2017-01-23 DIAGNOSIS — Z9889 Other specified postprocedural states: Secondary | ICD-10-CM

## 2017-01-23 DIAGNOSIS — O00102 Left tubal pregnancy without intrauterine pregnancy: Secondary | ICD-10-CM

## 2017-01-23 NOTE — Progress Notes (Signed)
OBSTETRICS/GYNECOLOGY POST-OPERATIVE CLINIC VISIT  Subjective:    Hospital Interpreter used for today's visit   Miranda Walsh is a 36 y.o. Hispanic female who presents to the clinic 1 weeks status post operative laparoscopy with left salpingectomy for ruptured left ectopic pregnancy with hemoperitoneum. Eating a regular diet without difficulty. Bowel movements are abnormal with constipation. Pain is controlled with current analgesics. Medications being used: prescription NSAID's including ibuprofen (Motrin) and narcotic analgesics including hydrocodone/acetaminophen (Lorcet, Lortab, Norco, Vicodin).   Past Medical History:  Diagnosis Date  . Medical history non-contributory     Past Surgical History:  Procedure Laterality Date  . DIAGNOSTIC LAPAROSCOPY WITH REMOVAL OF ECTOPIC PREGNANCY Left 01/14/2017   Procedure: DIAGNOSTIC LAPAROSCOPY WITH REMOVAL OF ECTOPIC PREGNANCY;  Surgeon: Hildred LaserAnika Cristiano Capri, MD;  Location: [facility] ORS;  Service: Gynecology;  Laterality: Left;    Family History  Problem Relation Age of Onset  . Cancer Neg Hx   . Hypertension Neg Hx   . Thyroid disease Neg Hx     Social History   Social History  . Marital status: Married    Spouse name: N/A  . Number of children: N/A  . Years of education: N/A   Occupational History  . Not on file.   Social History Main Topics  . Smoking status: Never Smoker  . Smokeless tobacco: Never Used  . Alcohol use No  . Drug use: No  . Sexual activity: Not Currently    Birth control/ protection: None   Other Topics Concern  . Not on file   Social History Narrative  . No narrative on file    Current Outpatient Prescriptions on File Prior to Visit  Medication Sig Dispense Refill  . docusate sodium (COLACE) 100 MG capsule Take 1 capsule (100 mg total) by mouth 2 (two) times daily. 30 capsule 2  . ferrous sulfate (FERROUSUL) 325 (65 FE) MG tablet Take 1 tablet (325 mg total) by mouth daily with breakfast.  30 tablet 1  . ibuprofen (ADVIL,MOTRIN) 600 MG tablet Take 1 tablet (600 mg total) by mouth every 6 (six) hours as needed (mild pain). 60 tablet 1  . oxyCODONE-acetaminophen (PERCOCET/ROXICET) 5-325 MG tablet Take 1-2 tablets by mouth every 6 (six) hours as needed for severe pain (moderate to severe pain (when tolerating fluids)). 30 tablet 0  . simethicone (MYLICON) 80 MG chewable tablet Chew 1 tablet (80 mg total) by mouth 4 (four) times daily as needed for flatulence. 30 tablet 1   No current facility-administered medications on file prior to visit.     No Known Allergies   Review of Systems Pertinent items noted in HPI and remainder of comprehensive ROS otherwise negative.    Objective:    BP 115/77 (BP Location: Left Arm, Patient Position: Sitting, Cuff Size: Normal)   Pulse 81   Wt 171 lb 11.2 oz (77.9 kg)   BMI 28.57 kg/m  General:  alert and no distress  Abdomen: soft, bowel sounds active, non-tender  Incision:   healing well, no drainage, no erythema, no hernia, no seroma, no swelling, no dehiscence, incision well approximated    Pathology (01/14/2017):   DIAGNOSIS:  A. ECTOPIC PREGNANCY, LEFT; REMOVAL:  - FALLOPIAN TUBE WITH CHORIONIC VILLI AND BLOOD CLOT, CONSISTENT WITH  ECTOPIC PREGNANCY.  - PARATUBAL CYST, BENIGN     Labs:  CBC Latest Ref Rng & Units 01/14/2017 01/13/2017  WBC 3.6 - 11.0 K/uL - 13.6(H)  Hemoglobin 12.0 - 16.0 g/dL 9.6(E9.1(L) 45.412.2  Hematocrit 35.0 -  47.0 % 26.8(L) 37.0  Platelets 150 - 440 K/uL - 160     Assessment:    Postoperative course complicated by constipation   Plan:   1. Continue any current medications.  Continue iron supplementations. Add Miralax to regimen for constipation.  2. Wound care discussed. 3. Operative findings again reviewed. Pathology report discussed. 4. Activity restrictions: no excessive bending, stooping, or squatting and no lifting more than 10 pounds for an additional week.  5. Anticipated return to work:  now. 6. HCG ordered today.  Will follow down to negative levels.  7. Discussed if patient desires pregnancy again soon, or if she desires to initiate contraception.  Notes that she would desire to attempt conception again soon.  Advised on waiting approximately 2-3 months from date of surgery to attempt to conceive, until she has had at least 1 normal menses.  Discussed risk of recurrence of ectopic due to history of ectopic pregnancy.  8. Follow up as needed.    Hildred Laser, MD [facility]

## 2017-01-24 LAB — BETA HCG QUANT (REF LAB): hCG Quant: 9 m[IU]/mL

## 2017-01-25 ENCOUNTER — Telehealth: Payer: Self-pay

## 2017-01-25 NOTE — Telephone Encounter (Signed)
-----   Message from Hildred LaserAnika Cherry, MD sent at 01/25/2017  8:33 AM EST ----- Patient requires an interpreter, please inform that hormone levels have essentially almost returned to negative. No further follow up required.

## 2017-01-26 ENCOUNTER — Encounter: Payer: Self-pay | Admitting: Obstetrics and Gynecology

## 2017-01-26 NOTE — Telephone Encounter (Signed)
Called pt using interpreter services. No answer, LM for pt to call back

## 2017-01-30 NOTE — Telephone Encounter (Signed)
Called pt using interpreter services, no answer. Interpreter LM for pt advising her to call back.

## 2017-01-31 NOTE — Telephone Encounter (Signed)
Pt letter mailed with results as I have tried to contact pt x 3.

## 2017-02-20 ENCOUNTER — Other Ambulatory Visit (INDEPENDENT_AMBULATORY_CARE_PROVIDER_SITE_OTHER): Payer: Self-pay

## 2017-02-20 DIAGNOSIS — O00102 Left tubal pregnancy without intrauterine pregnancy: Secondary | ICD-10-CM

## 2017-02-21 LAB — BETA HCG QUANT (REF LAB)

## 2017-05-21 LAB — HM PAP SMEAR

## 2018-10-03 ENCOUNTER — Encounter: Payer: Self-pay | Admitting: Emergency Medicine

## 2018-10-03 ENCOUNTER — Emergency Department: Payer: Self-pay

## 2018-10-03 ENCOUNTER — Emergency Department
Admission: EM | Admit: 2018-10-03 | Discharge: 2018-10-03 | Disposition: A | Discharge status: Home / Self Care | Payer: Self-pay | Attending: Emergency Medicine | Admitting: Emergency Medicine

## 2018-10-03 DIAGNOSIS — O200 Threatened abortion: Secondary | ICD-10-CM | POA: Insufficient documentation

## 2018-10-03 DIAGNOSIS — R1084 Generalized abdominal pain: Secondary | ICD-10-CM | POA: Insufficient documentation

## 2018-10-03 DIAGNOSIS — O9989 Other specified diseases and conditions complicating pregnancy, childbirth and the puerperium: Secondary | ICD-10-CM | POA: Insufficient documentation

## 2018-10-03 LAB — CBC WITH DIFFERENTIAL/PLATELET
ABS IMMATURE GRANULOCYTES: 0.02 10*3/uL (ref 0.00–0.07)
BASOS ABS: 0.1 10*3/uL (ref 0.0–0.1)
Basophils Relative: 1 %
Eosinophils Absolute: 0.2 10*3/uL (ref 0.0–0.5)
Eosinophils Relative: 3 %
HEMATOCRIT: 40.6 % (ref 36.0–46.0)
HEMOGLOBIN: 13.6 g/dL (ref 12.0–15.0)
Immature Granulocytes: 0 %
LYMPHS ABS: 2.1 10*3/uL (ref 0.7–4.0)
LYMPHS PCT: 32 %
MCH: 28.9 pg (ref 26.0–34.0)
MCHC: 33.5 g/dL (ref 30.0–36.0)
MCV: 86.2 fL (ref 80.0–100.0)
Monocytes Absolute: 0.4 10*3/uL (ref 0.1–1.0)
Monocytes Relative: 6 %
NEUTROS ABS: 3.8 10*3/uL (ref 1.7–7.7)
NEUTROS PCT: 58 %
NRBC: 0 % (ref 0.0–0.2)
Platelets: 151 10*3/uL (ref 150–400)
RBC: 4.71 MIL/uL (ref 3.87–5.11)
RDW: 12.4 % (ref 11.5–15.5)
WBC: 6.5 10*3/uL (ref 4.0–10.5)

## 2018-10-03 LAB — URINALYSIS, COMPLETE (UACMP) WITH MICROSCOPIC
BILIRUBIN URINE: NEGATIVE
GLUCOSE, UA: NEGATIVE mg/dL
Ketones, ur: NEGATIVE mg/dL
LEUKOCYTES UA: NEGATIVE
NITRITE: NEGATIVE
Protein, ur: NEGATIVE mg/dL
SPECIFIC GRAVITY, URINE: 1.005 (ref 1.005–1.030)
pH: 6 (ref 5.0–8.0)

## 2018-10-03 LAB — COMPREHENSIVE METABOLIC PANEL
ALK PHOS: 46 U/L (ref 38–126)
ALT: 18 U/L (ref 0–44)
ANION GAP: 6 (ref 5–15)
AST: 17 U/L (ref 15–41)
Albumin: 4.5 g/dL (ref 3.5–5.0)
BUN: 10 mg/dL (ref 6–20)
CALCIUM: 8.9 mg/dL (ref 8.9–10.3)
CO2: 24 mmol/L (ref 22–32)
CREATININE: 0.5 mg/dL (ref 0.44–1.00)
Chloride: 109 mmol/L (ref 98–111)
Glucose, Bld: 110 mg/dL — ABNORMAL HIGH (ref 70–99)
Potassium: 3.6 mmol/L (ref 3.5–5.1)
SODIUM: 139 mmol/L (ref 135–145)
Total Bilirubin: 0.7 mg/dL (ref 0.3–1.2)
Total Protein: 7.4 g/dL (ref 6.5–8.1)

## 2018-10-03 LAB — POCT PREGNANCY, URINE: Preg Test, Ur: POSITIVE — AB

## 2018-10-03 LAB — ABO/RH: ABO/RH(D): A POS

## 2018-10-03 LAB — HCG, QUANTITATIVE, PREGNANCY: hCG, Beta Chain, Quant, S: 126 m[IU]/mL — ABNORMAL HIGH (ref <5)

## 2018-10-03 NOTE — ED Triage Notes (Signed)
Patient reports positive pregnancy test at home. Patient c/o abdominal pain, and vaginal bleeding.

## 2018-10-03 NOTE — ED Notes (Signed)
Patient reports hx of previous ectopic pregnancy.

## 2018-10-03 NOTE — ED Provider Notes (Signed)
[facility] Emergency Department Provider Note       Time seen: ----------------------------------------- 10:02 PM on 10/03/2018 -----------------------------------------   I have reviewed the triage vital signs and the nursing notes.  HISTORY   Chief Complaint Vaginal Bleeding    HPI Miranda Walsh is a 37 y.o. female with a history of ectopic pregnancy who presents to the ED for pregnancy with abdominal pain and vaginal bleeding.  She is not sure how far along she is.  She denies fevers, chills or other complaints.  Past Medical History:  Diagnosis Date  . Medical history non-contributory     Patient Active Problem List   Diagnosis Date Noted  . S/P laparoscopic surgery 01/14/2017    Past Surgical History:  Procedure Laterality Date  . DIAGNOSTIC LAPAROSCOPY WITH REMOVAL OF ECTOPIC PREGNANCY Left 01/14/2017   Procedure: DIAGNOSTIC LAPAROSCOPY WITH REMOVAL OF ECTOPIC PREGNANCY;  Surgeon: Hildred Laser, MD;  Location: [facility] ORS;  Service: Gynecology;  Laterality: Left;    Allergies Patient has no known allergies.  Social History Social History   Tobacco Use  . Smoking status: Never Smoker  . Smokeless tobacco: Never Used  Substance Use Topics  . Alcohol use: No  . Drug use: No   Review of Systems Constitutional: Negative for fever. Cardiovascular: Negative for chest pain. Respiratory: Negative for shortness of breath. Gastrointestinal: Negative for abdominal pain, vomiting and diarrhea. Genitourinary: Positive for abdominal pain, vaginal bleeding Musculoskeletal: Negative for back pain. Skin: Negative for rash. Neurological: Negative for headaches, focal weakness or numbness.  All systems negative/normal/unremarkable except as stated in the HPI  ____________________________________________   PHYSICAL EXAM:  VITAL SIGNS: ED Triage Vitals  Enc Vitals Group     BP 10/03/18 2014 (!) 162/91     Pulse Rate 10/03/18  2014 87     Resp 10/03/18 2014 15     Temp 10/03/18 2014 98.8 F (37.1 C)     Temp Source 10/03/18 2014 Oral     SpO2 --      Weight 10/03/18 2011 170 lb (77.1 kg)     Height --      Head Circumference --      Peak Flow --      Pain Score 10/03/18 2010 4     Pain Loc --      Pain Edu? --      Excl. in GC? --    Constitutional: Alert and oriented. Well appearing and in no distress. Cardiovascular: Normal rate, regular rhythm. No murmurs, rubs, or gallops. Respiratory: Normal respiratory effort without tachypnea nor retractions. Breath sounds are clear and equal bilaterally. No wheezes/rales/rhonchi. Gastrointestinal: Soft and nontender. Normal bowel sounds Musculoskeletal: Nontender with normal range of motion in extremities. No lower extremity tenderness nor edema. Neurologic:  Normal speech and language. No gross focal neurologic deficits are appreciated.  Skin:  Skin is warm, dry and intact. No rash noted. Psychiatric: Mood and affect are normal. Speech and behavior are normal.  ____________________________________________  ED COURSE:  As part of my medical decision making, I reviewed the following data within the electronic MEDICAL RECORD NUMBER History obtained from family if available, nursing notes, old chart and ekg, as well as notes from prior ED visits. Patient presented for abdominal pain, vaginal bleeding in early pregnancy, we will assess with labs and imaging as indicated at this time.   Procedures ____________________________________________   LABS (pertinent positives/negatives)  Labs Reviewed  HCG, QUANTITATIVE, PREGNANCY - Abnormal; Notable for the following components:  Result Value   hCG, Beta Chain, Quant, S 126 (*)    All other components within normal limits  COMPREHENSIVE METABOLIC PANEL - Abnormal; Notable for the following components:   Glucose, Bld 110 (*)    All other components within normal limits  URINALYSIS, COMPLETE (UACMP) WITH MICROSCOPIC -  Abnormal; Notable for the following components:   Color, Urine STRAW (*)    APPearance CLEAR (*)    Hgb urine dipstick LARGE (*)    Bacteria, UA RARE (*)    All other components within normal limits  POCT PREGNANCY, URINE - Abnormal; Notable for the following components:   Preg Test, Ur POSITIVE (*)    All other components within normal limits  CBC WITH DIFFERENTIAL/PLATELET  POC URINE PREG, ED  ABO/RH    RADIOLOGY Images were viewed by me  Pregnancy ultrasound  IMPRESSION: No intrauterine gestational sac, yolk sac, or fetal pole identified. Differential considerations include intrauterine pregnancy too early to be sonographically visualized, missed abortion, or ectopic pregnancy. Followup ultrasound is recommended in 10-14 days for further evaluation. 3.4 cm diameter left ovarian cyst. Small amount of free fluid in the pelvis. ____________________________________________  DIFFERENTIAL DIAGNOSIS   Ectopic pregnancy, normal pregnancy, threatened miscarriage, UTI  FINAL ASSESSMENT AND PLAN  Threatened miscarriage   Plan: The patient had presented for abdominal pain and bleeding in early pregnancy. Patient's labs revealed a very low hCG level but was otherwise unremarkable.  Patient's imaging was negative as expected with a low hCG level.  We will have her return in 2 days for repeat hCG level and reevaluation.   Ulice Dash, MD   Note: This note was generated in part or whole with voice recognition software. Voice recognition is usually quite accurate but there are transcription errors that can and very often do occur. I apologize for any typographical errors that were not detected and corrected.     Emily Filbert, MD 10/03/18 2205

## 2018-10-03 NOTE — ED Notes (Signed)
Spanish interpreter requested for RN and MD assessment.

## 2018-10-05 ENCOUNTER — Encounter: Payer: Self-pay | Admitting: Emergency Medicine

## 2018-10-05 ENCOUNTER — Emergency Department
Admission: EM | Admit: 2018-10-05 | Discharge: 2018-10-05 | Disposition: A | Discharge status: Home / Self Care | Payer: Self-pay | Attending: Emergency Medicine | Admitting: Emergency Medicine

## 2018-10-05 ENCOUNTER — Other Ambulatory Visit: Payer: Self-pay

## 2018-10-05 ENCOUNTER — Emergency Department: Payer: Self-pay

## 2018-10-05 DIAGNOSIS — O3680X0 Pregnancy with inconclusive fetal viability, not applicable or unspecified: Secondary | ICD-10-CM

## 2018-10-05 DIAGNOSIS — O209 Hemorrhage in early pregnancy, unspecified: Secondary | ICD-10-CM | POA: Insufficient documentation

## 2018-10-05 DIAGNOSIS — Z79899 Other long term (current) drug therapy: Secondary | ICD-10-CM | POA: Insufficient documentation

## 2018-10-05 DIAGNOSIS — Z3A00 Weeks of gestation of pregnancy not specified: Secondary | ICD-10-CM | POA: Insufficient documentation

## 2018-10-05 DIAGNOSIS — R102 Pelvic and perineal pain: Secondary | ICD-10-CM | POA: Insufficient documentation

## 2018-10-05 LAB — HCG, QUANTITATIVE, PREGNANCY: hCG, Beta Chain, Quant, S: 400 m[IU]/mL — ABNORMAL HIGH (ref <5)

## 2018-10-05 NOTE — Discharge Instructions (Addendum)
Return to the emergency department or see the [facility] department or Dr. Valentino Saxon for repeat hCG testing and 2 to 3 days.  If you have increased abdominal pain please return the emergency department.  You may need a repeat ultrasound in 7 to 10 days.

## 2018-10-05 NOTE — ED Notes (Signed)
Pt to ed for c/o lower abd pain and vaginal bleeding in early pregnancy.  Told to return to ed for HCG repeat after being seen here 2 days ago for vaginal bleeding.  Pt alert and oriented. Husband at bedside.

## 2018-10-05 NOTE — ED Provider Notes (Signed)
[facility] Emergency Department Provider Note  ____________________________________________   None    (approximate)  I have reviewed the triage vital signs and the nursing notes.   HISTORY  Chief Complaint Vaginal Bleeding    HPI Miranda Walsh is a 37 y.o. female since emergency department complaining of vaginal bleeding in early pregnancy.  She was seen here 2 days ago and her beta hCG was 126.  The ultrasound did not show a IUP or ectopic.  Patient has a history of an ectopic pregnancy which ruptured in the left fallopian tube.  She denies any pain other than in the center of the pelvis.  She had a small amount of bleeding.    Past Medical History:  Diagnosis Date  . Medical history non-contributory     Patient Active Problem List   Diagnosis Date Noted  . S/P laparoscopic surgery 01/14/2017    Past Surgical History:  Procedure Laterality Date  . DIAGNOSTIC LAPAROSCOPY WITH REMOVAL OF ECTOPIC PREGNANCY Left 01/14/2017   Procedure: DIAGNOSTIC LAPAROSCOPY WITH REMOVAL OF ECTOPIC PREGNANCY;  Surgeon: Hildred Laser, MD;  Location: [facility] ORS;  Service: Gynecology;  Laterality: Left;    Prior to Admission medications   Medication Sig Start Date End Date Taking? Authorizing Provider  docusate sodium (COLACE) 100 MG capsule Take 1 capsule (100 mg total) by mouth 2 (two) times daily. 01/14/17   Hildred Laser, MD  ferrous sulfate (FERROUSUL) 325 (65 FE) MG tablet Take 1 tablet (325 mg total) by mouth daily with breakfast. 01/14/17   Hildred Laser, MD  ibuprofen (ADVIL,MOTRIN) 600 MG tablet Take 1 tablet (600 mg total) by mouth every 6 (six) hours as needed (mild pain). 01/14/17   Hildred Laser, MD  oxyCODONE-acetaminophen (PERCOCET/ROXICET) 5-325 MG tablet Take 1-2 tablets by mouth every 6 (six) hours as needed for severe pain (moderate to severe pain (when tolerating fluids)). 01/14/17   Hildred Laser, MD  simethicone (MYLICON) 80 MG chewable  tablet Chew 1 tablet (80 mg total) by mouth 4 (four) times daily as needed for flatulence. 01/14/17   Hildred Laser, MD    Allergies Patient has no known allergies.  Family History  Problem Relation Age of Onset  . Cancer Neg Hx   . Hypertension Neg Hx   . Thyroid disease Neg Hx     Social History Social History   Tobacco Use  . Smoking status: Never Smoker  . Smokeless tobacco: Never Used  Substance Use Topics  . Alcohol use: No  . Drug use: No    Review of Systems  Constitutional: No fever/chills Eyes: No visual changes. ENT: No sore throat. Respiratory: Denies cough Genitourinary: Negative for dysuria.  Positive for vaginal bleeding and early pregnancy Musculoskeletal: Negative for back pain. Skin: Negative for rash.    ____________________________________________   PHYSICAL EXAM:  VITAL SIGNS: ED Triage Vitals  Enc Vitals Group     BP 10/05/18 1532 (!) 161/91     Pulse Rate 10/05/18 1532 83     Resp 10/05/18 1532 18     Temp 10/05/18 1532 98.2 F (36.8 C)     Temp Source 10/05/18 1532 Oral     SpO2 10/05/18 1532 100 %     Weight 10/05/18 1533 170 lb (77.1 kg)     Height --      Head Circumference --      Peak Flow --      Pain Score 10/05/18 1533 2     Pain Loc --  Pain Edu? --      Excl. in GC? --     Constitutional: Alert and oriented. Well appearing and in no acute distress. Eyes: Conjunctivae are normal.  Head: Atraumatic. Nose: No congestion/rhinnorhea. Mouth/Throat: Mucous membranes are moist.   Neck:  supple no lymphadenopathy noted Cardiovascular: Normal rate, regular rhythm. Heart sounds are normal Respiratory: Normal respiratory effort.  No retractions, lungs c t a  Abd: soft tender in the suprapubic area Bs normal all 4 quad,  GU: deferred Musculoskeletal: FROM all extremities, warm and well perfused Neurologic:  Normal speech and language.  Skin:  Skin is warm, dry and intact. No rash noted. Psychiatric: Mood and affect are  normal. Speech and behavior are normal.  ____________________________________________   LABS (all labs ordered are listed, but only abnormal results are displayed)  Labs Reviewed  HCG, QUANTITATIVE, PREGNANCY - Abnormal; Notable for the following components:      Result Value   hCG, Beta Chain, Quant, S 400 (*)    All other components within normal limits   ____________________________________________   ____________________________________________  RADIOLOGY  Ultrasound OB less than 14 weeks still does not show an IUP or ectopic  ____________________________________________   PROCEDURES  Procedure(s) performed: No  Procedures    ____________________________________________   INITIAL IMPRESSION / ASSESSMENT AND PLAN / ED COURSE  Pertinent labs & imaging results that were available during my care of the patient were reviewed by me and considered in my medical decision making (see chart for details).   Patient is 37 year old female presents emergency department for repeat hCG.  Her hCG 2 days ago was 126.  Today it is 400.  She continues to complain of some pelvic pain.  On physical exam the suprapubic area is mildly tender.  The repeat ultrasound OB less than 14 weeks was ordered.     As part of my medical decision making, I reviewed the following data within the electronic MEDICAL RECORD NUMBER Nursing notes reviewed and incorporated, Interpreter needed, Labs reviewed hCG had increased to 400, Old chart reviewed, Radiograph reviewed ultrasound of the abdomen/pelvis with transvaginal is still negative for IUP or ectopic, Notes from prior ED visits and [place] Controlled Substance Database  ____________________________________________   FINAL CLINICAL IMPRESSION(S) / ED DIAGNOSES  Final diagnoses:  Pregnancy of unknown anatomic location      NEW MEDICATIONS STARTED DURING THIS VISIT:  Discharge Medication List as of 10/05/2018  6:25 PM       Note:  This document  was prepared using Dragon voice recognition software and may include unintentional dictation errors.    Faythe Ghee, PA-C 10/05/18 2127    Jeanmarie Plant, MD 10/05/18 2221

## 2018-10-05 NOTE — ED Triage Notes (Signed)
States was here 2 days ago for vaginal bleeding in early pregnancy. Told to return for repeat HCG. States vaginal bleeding has stopped. States slight lower abdominal pain.

## 2018-10-08 ENCOUNTER — Emergency Department
Admission: EM | Admit: 2018-10-08 | Discharge: 2018-10-08 | Disposition: A | Discharge status: Other Acute Inpatient Hospital | Payer: Self-pay

## 2018-10-08 ENCOUNTER — Other Ambulatory Visit: Payer: Self-pay

## 2018-10-08 DIAGNOSIS — O009 Unspecified ectopic pregnancy without intrauterine pregnancy: Secondary | ICD-10-CM

## 2018-10-08 NOTE — ED Triage Notes (Signed)
Spanish interpreter used to triage.   "they told me to come back for a blood test." has DC papers from 3 days ago with information for health dept and Dr. Valentino Saxonherry. States health dept told her to wait until she was 8 weeks.   No complaints, here only for blood draw.   Miranda IvanLiz, RN called [facility] during triage process and they stated they have a lab tech to draw pt's blood and could do it today.   Pt is going to go to [facility] to get her blood drawn.

## 2018-10-09 LAB — BETA HCG QUANT (REF LAB): hCG Quant: 986 m[IU]/mL

## 2018-10-11 ENCOUNTER — Telehealth: Payer: Self-pay

## 2018-10-11 NOTE — Telephone Encounter (Signed)
Using [place]pre[place], ID number [phone]  LVM message for pt to return call and schedule a follow up appt with DJE. Will call other available number next.

## 2018-10-11 NOTE — Telephone Encounter (Signed)
Using [place]preter [place], ID number [id number]  This is an invalid number and does not belong to the patient.

## 2018-10-15 ENCOUNTER — Ambulatory Visit (INDEPENDENT_AMBULATORY_CARE_PROVIDER_SITE_OTHER): Payer: Self-pay

## 2018-10-15 ENCOUNTER — Other Ambulatory Visit: Payer: Self-pay | Admitting: Obstetrics and Gynecology

## 2018-10-15 ENCOUNTER — Telehealth: Payer: Self-pay

## 2018-10-15 DIAGNOSIS — Z3491 Encounter for supervision of normal pregnancy, unspecified, first trimester: Secondary | ICD-10-CM

## 2018-10-15 NOTE — Telephone Encounter (Signed)
Interpreter Jesus, ID # [id number] Pt did not answer, LVM for her to call back and schedule an appt with DJE to est care and discuss lab results.

## 2018-11-07 ENCOUNTER — Ambulatory Visit
Admission: RE | Admit: 2018-11-07 | Discharge: 2018-11-07 | Disposition: A | Discharge status: Home / Self Care | Payer: Self-pay | Source: Ambulatory Visit | Attending: Physician Assistant | Admitting: Physician Assistant

## 2018-11-07 ENCOUNTER — Other Ambulatory Visit: Payer: Self-pay | Admitting: Physician Assistant

## 2018-11-07 DIAGNOSIS — O26851 Spotting complicating pregnancy, first trimester: Secondary | ICD-10-CM

## 2018-11-07 DIAGNOSIS — O3680X0 Pregnancy with inconclusive fetal viability, not applicable or unspecified: Secondary | ICD-10-CM

## 2018-11-07 DIAGNOSIS — O30001 Twin pregnancy, unspecified number of placenta and unspecified number of amniotic sacs, first trimester: Secondary | ICD-10-CM | POA: Insufficient documentation

## 2018-11-07 DIAGNOSIS — Z3A08 8 weeks gestation of pregnancy: Secondary | ICD-10-CM | POA: Insufficient documentation

## 2018-12-17 DIAGNOSIS — R030 Elevated blood-pressure reading, without diagnosis of hypertension: Secondary | ICD-10-CM | POA: Insufficient documentation

## 2020-02-28 ENCOUNTER — Ambulatory Visit: Payer: Self-pay | Attending: Internal Medicine

## 2020-02-28 DIAGNOSIS — Z23 Encounter for immunization: Secondary | ICD-10-CM

## 2020-02-28 NOTE — Progress Notes (Signed)
   Covid-19 Vaccination Clinic  Name:  Miranda Walsh    MRN: [id number] DOB: Apr 25, 1981  02/28/2020  [name] was observed post Covid-19 immunization for 15 minutes without incident. She was provided with Vaccine Information Sheet and instruction to access the V-Safe system.   [name] was instructed to call 911 with any severe reactions post vaccine: Marland Kitchen Difficulty breathing  . Swelling of face and throat  . A fast heartbeat  . A bad rash all over body  . Dizziness and weakness   Immunizations Administered    Name Date Dose VIS Date Route   Pfizer COVID-19 Vaccine 02/28/2020  3:35 PM 0.3 mL 11/07/2019 Intramuscular   Manufacturer: [employer], [employer]   Lot: [id number]   NDC: [address]-8

## 2020-03-20 ENCOUNTER — Ambulatory Visit: Payer: Self-pay | Attending: Internal Medicine

## 2020-03-20 DIAGNOSIS — Z23 Encounter for immunization: Secondary | ICD-10-CM

## 2020-03-20 NOTE — Progress Notes (Signed)
   Covid-19 Vaccination Clinic  Name:  Miranda Walsh    MRN: [id number] DOB: [birth year]  03/20/2020  [name] was observed post Covid-19 immunization for 15 minutes without incident. She was provided with Vaccine Information Sheet and instruction to access the V-Safe system.   [name] was instructed to call 911 with any severe reactions post vaccine: Marland Kitchen Difficulty breathing  . Swelling of face and throat  . A fast heartbeat  . A bad rash all over body  . Dizziness and weakness   Immunizations Administered    Name Date Dose VIS Date Route   Pfizer COVID-19 Vaccine 03/20/2020 12:16 PM 0.3 mL 01/21/2019 Intramuscular   Manufacturer: [employer], [employer]   Lot: [id number]   NDC: [address]-6

## 2020-03-29 ENCOUNTER — Encounter: Payer: Self-pay | Admitting: Family Medicine

## 2020-03-29 ENCOUNTER — Other Ambulatory Visit: Payer: Self-pay

## 2020-03-29 ENCOUNTER — Ambulatory Visit (LOCAL_COMMUNITY_HEALTH_CENTER): Payer: Self-pay | Admitting: Family Medicine

## 2020-03-29 VITALS — BP 148/96 | Ht 65.5 in | Wt 177.4 lb

## 2020-03-29 DIAGNOSIS — Z3169 Encounter for other general counseling and advice on procreation: Secondary | ICD-10-CM

## 2020-03-29 DIAGNOSIS — R030 Elevated blood-pressure reading, without diagnosis of hypertension: Secondary | ICD-10-CM

## 2020-03-29 DIAGNOSIS — Z3009 Encounter for other general counseling and advice on contraception: Secondary | ICD-10-CM

## 2020-03-29 NOTE — Progress Notes (Signed)
Family Planning Visit- Initial Visit  Subjective:  Miranda Walsh is a 39 y.o.  G2P0010  being seen today for an initial well woman visit and to discuss family planning options. Patient reports they do want a pregnancy in the next year.   Chief Complaint  Patient presents with  . Gynecologic Exam    Pt has S/P laparoscopic surgery; Elevated BP without diagnosis of hypertension; and Elevated blood pressure reading on their problem list.   HPI  Patient reports she is here for physical.   BP elevated today in clinic today. She states this has happened occasionally in the past, does not have dx of HTN.   Patient's last menstrual period was 03/11/2020 (exact date). BCM: none Pt desires EC? N/a - desires pregnancy  Last pap: 3 yrs ago in [place]  Last breast exam: unsure Personal/family hx breast cancer? no  Patient reports 1 partner(s) in last year. Do they desire STI screening (if no, why not)? no  Does the patient desire a pregnancy in the next year? yes   39 y.o., Body mass index is 29.07 kg/m. - Is patient eligible for HA1C diabetes screening based on BMI and age >17?  no  Has patient been screened once for HCV in the past?  no  No results found for: HCVAB  Does the patient have current of drug use, have a partner with drug use, and/or has been incarcerated since last result? no If yes-- Screen for HCV through [facility]   Does the patient meet criteria for HBV testing? no  Criteria:  -Household, sexual or needle sharing contact with HBV -History of drug use -HIV positive -Those with known Hep C  See flowsheet for other program required questions.     Health Maintenance Due  Topic Date Due  . HIV Screening  Never done  . TETANUS/TDAP  Never done  . PAP SMEAR-Modifier  Never done    Review of Systems  Constitutional: Negative.   HENT: Negative.   Eyes: Negative.   Respiratory: Negative.   Cardiovascular: Negative.   Gastrointestinal:  Negative.   Genitourinary: Negative.   Musculoskeletal: Negative.   Skin: Negative.   Neurological: Positive for headaches.  Endo/Heme/Allergies: Negative.   Psychiatric/Behavioral: Negative.     The following portions of the patient's history were reviewed and updated as appropriate: allergies, current medications, past family history, past medical history, past social history, past surgical history and problem list. Problem list updated.   See flowsheet for other program required questions.  Objective:   Vitals:   03/29/20 1412  BP: (!) 148/96  Weight: 177 lb 6.4 oz (80.5 kg)  Height: 5' 5.5" (1.664 m)    Physical Exam  Vitals and nursing note reviewed.  Constitutional:      Appearance: Normal appearance.  HENT:     Head: Normocephalic and atraumatic.     Mouth/Throat:     Mouth: Mucous membranes are moist.     Pharynx: Oropharynx is clear. No oropharyngeal exudate or posterior oropharyngeal erythema.  Eyes:     Conjunctiva/sclera: Conjunctivae normal.  Neck:     Thyroid: No thyroid mass, thyromegaly or thyroid tenderness.  Cardiovascular:     Rate and Rhythm: Normal rate and regular rhythm.     Pulses: Normal pulses.     Heart sounds: Normal heart sounds.  Pulmonary:     Effort: Pulmonary effort is normal.     Breath sounds: Normal breath sounds.  Chest:     Breasts:  Right: Normal. No swelling, mass, nipple discharge, skin change or tenderness.        Left: Normal. No swelling, mass, nipple discharge, skin change or tenderness.  Abdominal:     General: Abdomen is flat.     Palpations: There is no mass.     Tenderness: There is no abdominal tenderness. There is no rebound.  Genitourinary: not indicated Skin:    General: Skin is warm and dry.     Findings: No rash.  Neurological:     Mental Status: She is alert and oriented to person, place, and time.       Assessment and Plan:  Miranda Walsh is a 39 y.o. female presenting to the  [facility] Department for an initial well woman exam/family planning visit.  Contraception counseling: Reviewed all forms of birth control options in the tiered based approach. available including abstinence; over the counter/barrier methods; hormonal contraceptive medication including pill, patch, ring, injection,contraceptive implant, ECP; hormonal and nonhormonal IUDs; permanent sterilization options including vasectomy and the various tubal sterilization modalities. Risks, benefits, and typical effectiveness rates were reviewed.  Questions were answered.  Written information was also given to the patient to review.  Patient desires none. She will follow up in  1 year for surveillance.  She was told to call with any further questions, or with any concerns about this method of contraception.  Emphasized use of condoms 100% of the time for STI prevention.  Emergency Contraception: n/a - pt desires pregnancy  1. Family planning services -BCM: none, pt desires pregnancy -Paps done at [facility] - pt to sign ROI today -CBE done today. Recommended screening mammograms beginning at age 83. -Recommended PCP f/u for advice on simple cyst monitoring.   2. Pre-conception counseling -Preconception counseling today: -Advised to begin taking PNV w/folic acid.  -Encouraged regular exercise and healthy diet w/plenty of fruits and vegetables.  -We reviewed their current problems and medications in terms of pregnancy safety.  -Immunizations up to date -We discussed fertility awareness, when to take a pregnancy test, and general early pregnancy precautions.   -Reviewed services at the office regarding prenatal care.   3. Elevated BP without diagnosis of hypertension -PCP list given, advised to f/u regarding elevated BP.     Return in about 1 year (around 03/29/2021) for yearly wellness exam.  No future appointments.  Kandee Keen, PA-C

## 2020-03-29 NOTE — Progress Notes (Signed)
Here today as a new patient. No records of PE's or pap Smears here. Declines birth control today. Plans a pregnancy in the next year. Declines all STD screening today. Tawny Hopping, RN

## 2020-04-19 ENCOUNTER — Encounter: Payer: Self-pay | Admitting: Physician Assistant

## 2020-06-18 IMAGING — US US OB TRANSVAGINAL
1 series · 14 of 28 positions shown · non-contrast
Comparison: Pelvic ultrasound 10/03/2018

CLINICAL DATA: Pregnant patient with pelvic pain.

EXAM:
OBSTETRIC <14 WK US AND TRANSVAGINAL OB
TECHNIQUE: Both transabdominal and transvaginal ultrasound examinations were
performed for complete evaluation of the gestation as well as the
maternal uterus, adnexal regions, and pelvic cul-de-sac.
Transvaginal technique was performed to assess early pregnancy.

[Series 1: us ob transvaginal · 14 of 91 slices shown]
[im 4/91]
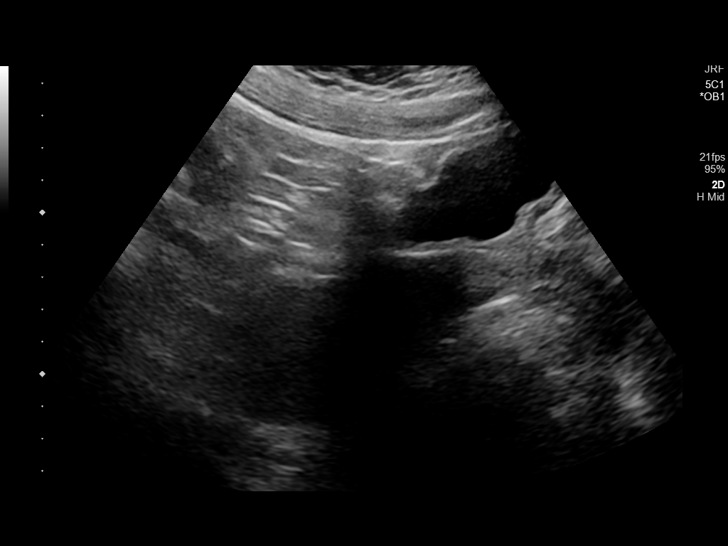
[im 11/91]
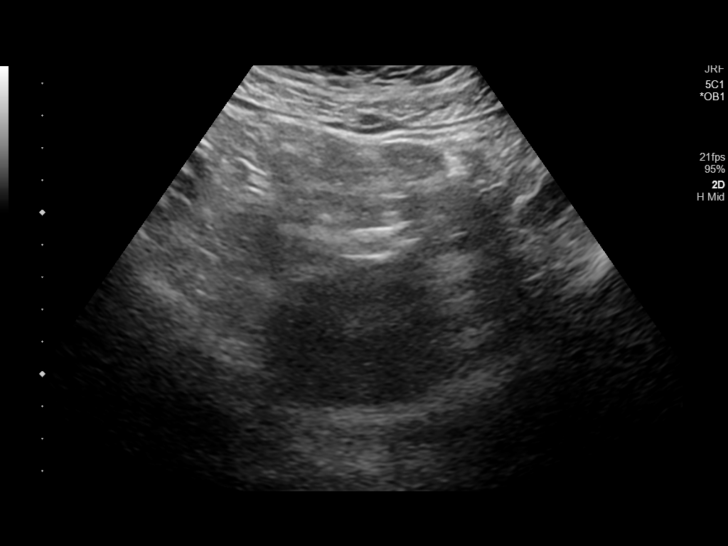
[im 17/91]
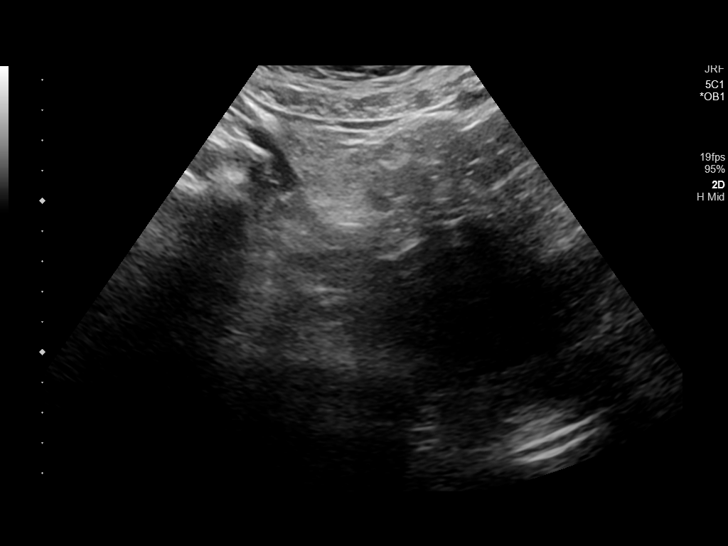
[im 24/91]
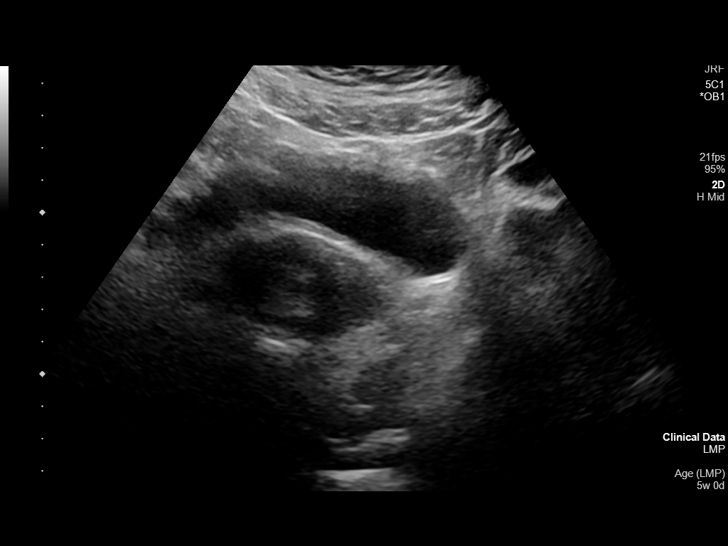
[im 31/91]
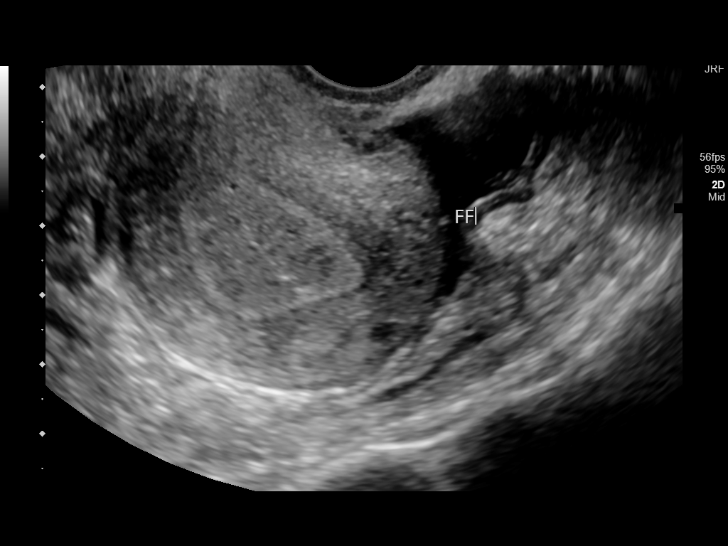
[im 37/91]
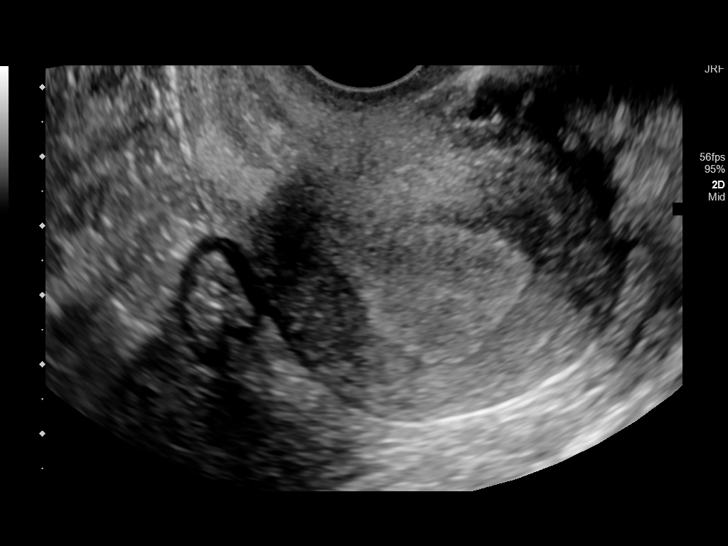
[im 44/91]
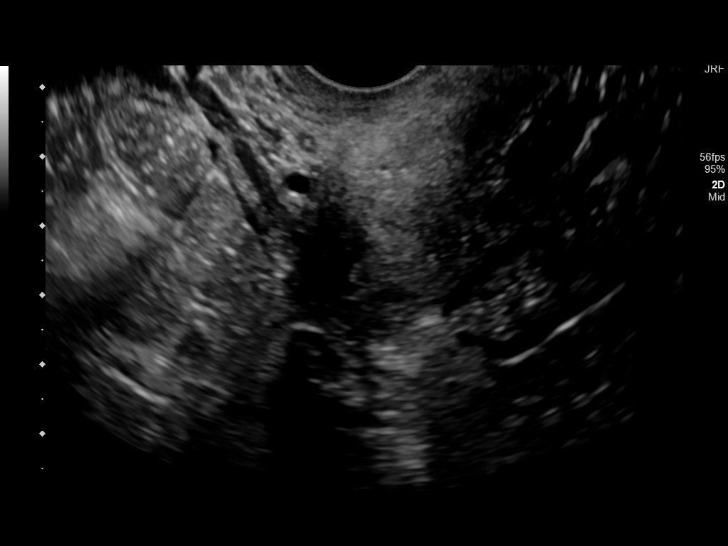
[im 51/91]
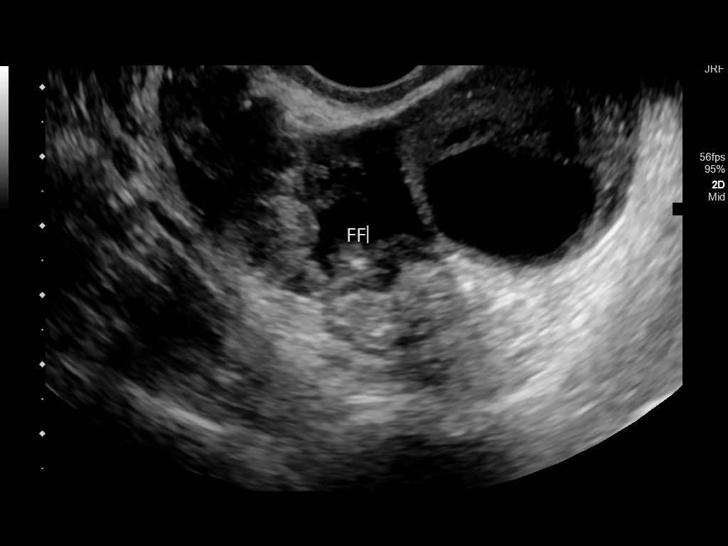
[im 57/91]
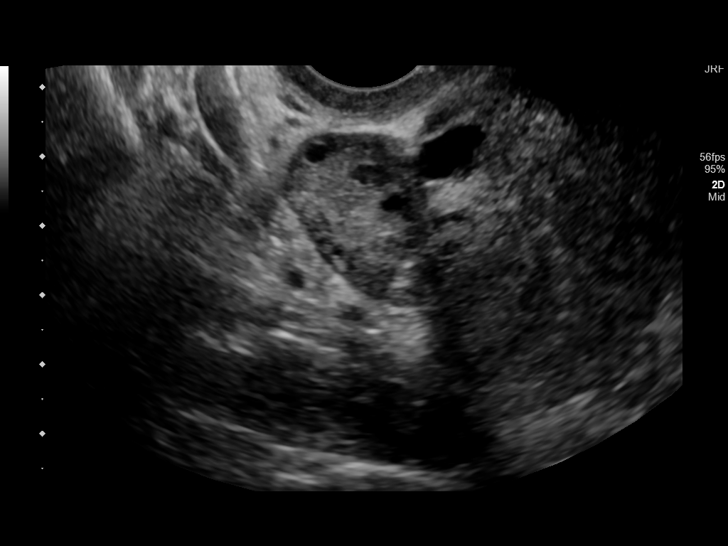
[im 64/91]
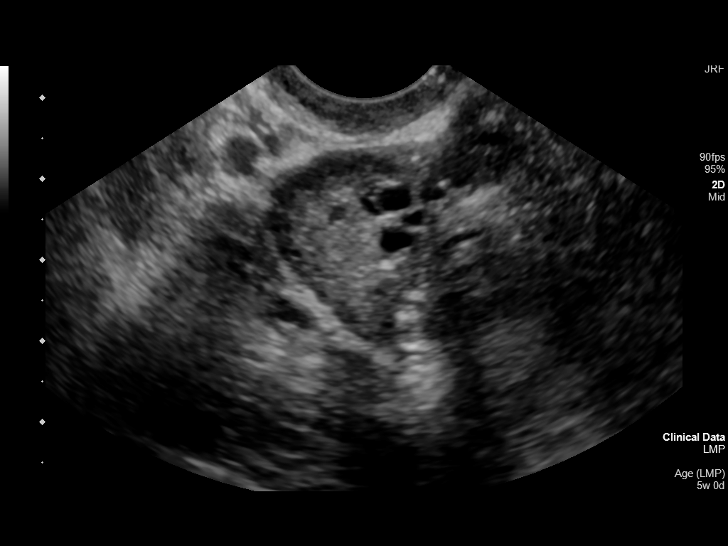
[im 71/91]
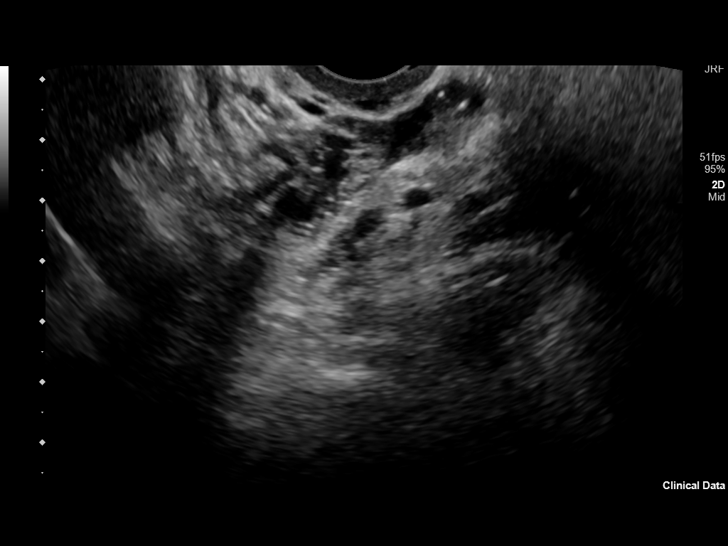
[im 77/91]
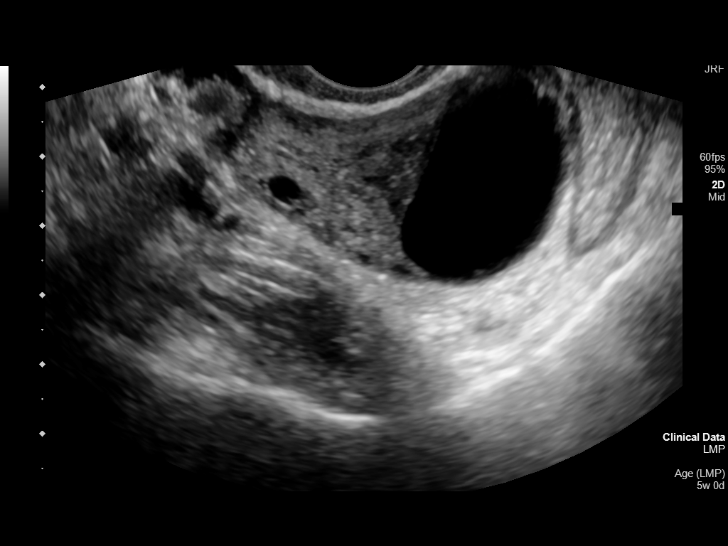
[im 84/91]
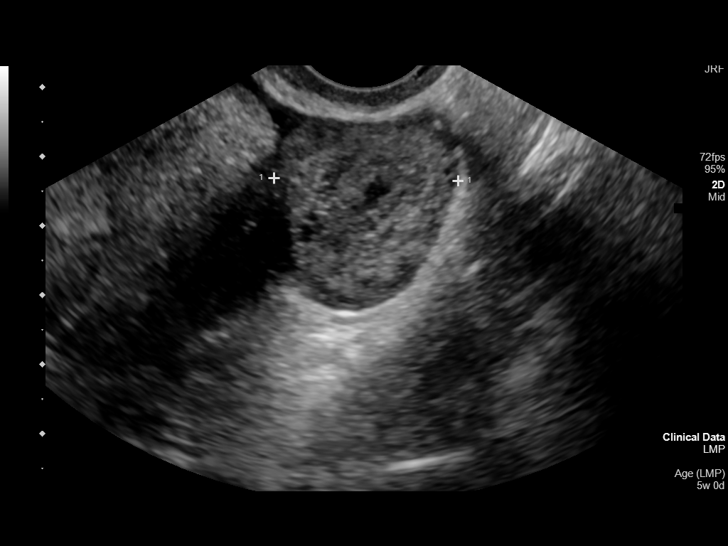
[im 91/91]
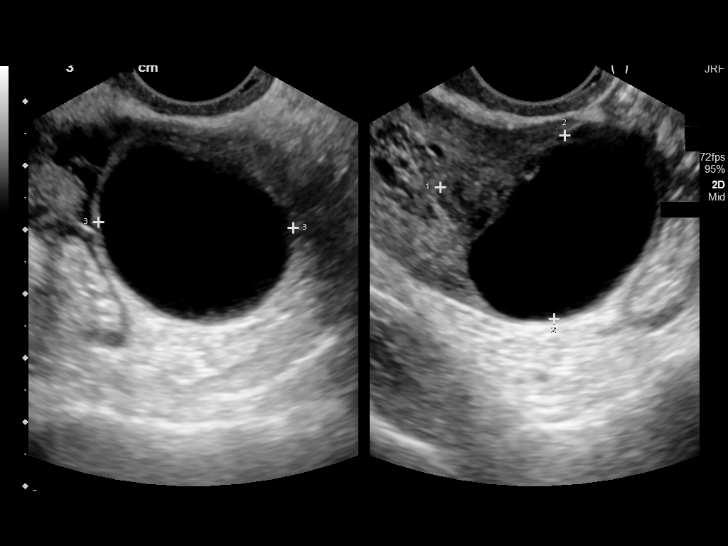

[14 of 28 positions shown; findings below may reference images not displayed]

FINDINGS: Intrauterine gestational sac: None

Yolk sac:  Not Visualized.

Embryo:  Not Visualized.

Cardiac Activity: Not Visualized.

Maternal uterus/adnexae: Normal right ovary. There is a 3.6 cm cyst
within the left ovary. No adnexal mass identified. Small amount of
free fluid in the pelvis.
IMPRESSION: No intrauterine gestation identified. In the setting of positive
pregnancy test and no definite intrauterine pregnancy, this reflects
a pregnancy of unknown location. Differential considerations include
early normal IUP, abnormal IUP, or nonvisualized ectopic pregnancy.
Differentiation is achieved with serial beta HCG supplemented by
repeat sonography as clinically warranted.

Small amount of free fluid in the pelvis.

Left ovarian cyst.

## 2020-07-21 IMAGING — US US OB < 14 WEEKS - US OB TV
1 series · 14 of 28 positions shown · non-contrast
Comparison: None.

CLINICAL DATA: Vaginal bleeding.

EXAM:
TWIN OBSTETRIC <14WK US AND TRANSVAGINAL OB US

[Series 1: us ob < 14 weeks - us ob tv · 14 of 103 slices shown]
[im 4/103]
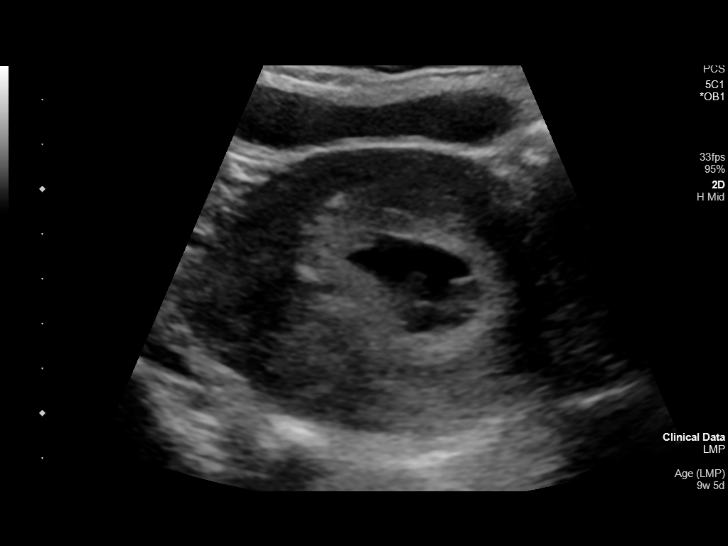
[im 12/103]
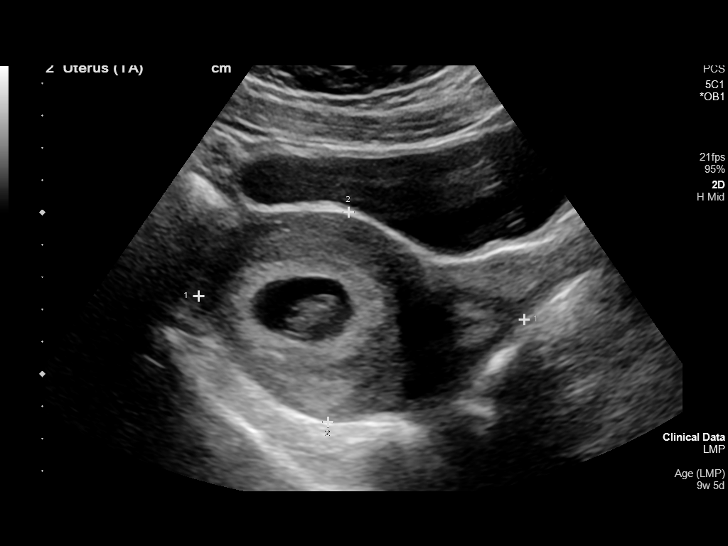
[im 19/103]
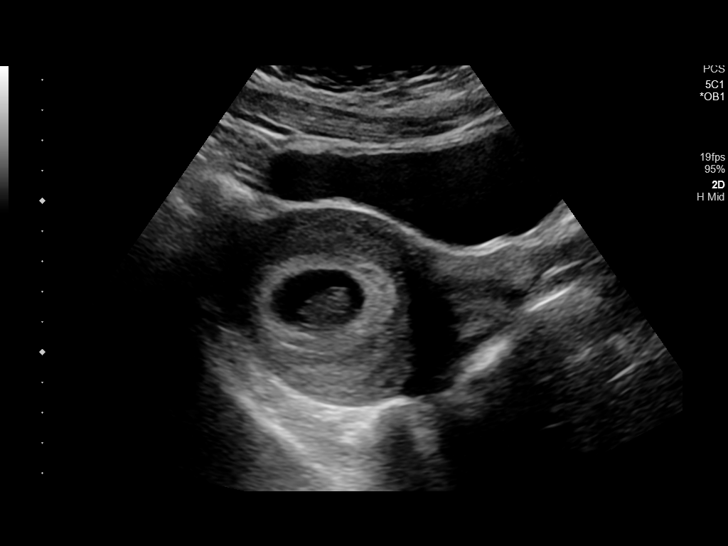
[im 27/103]
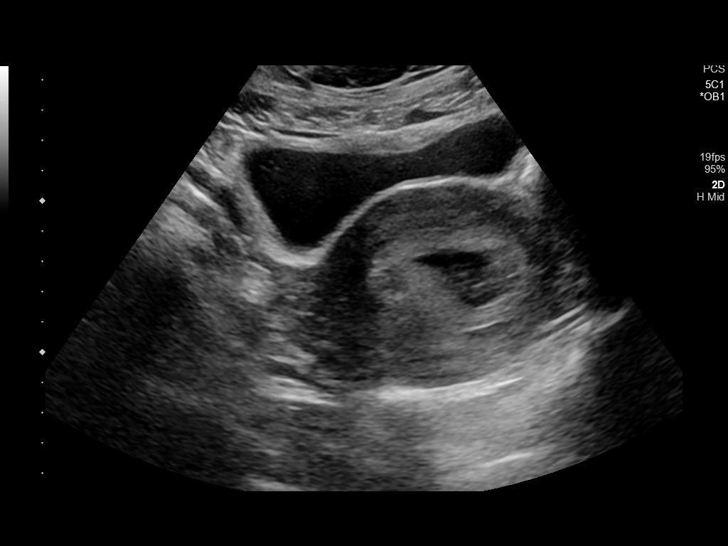
[im 35/103]
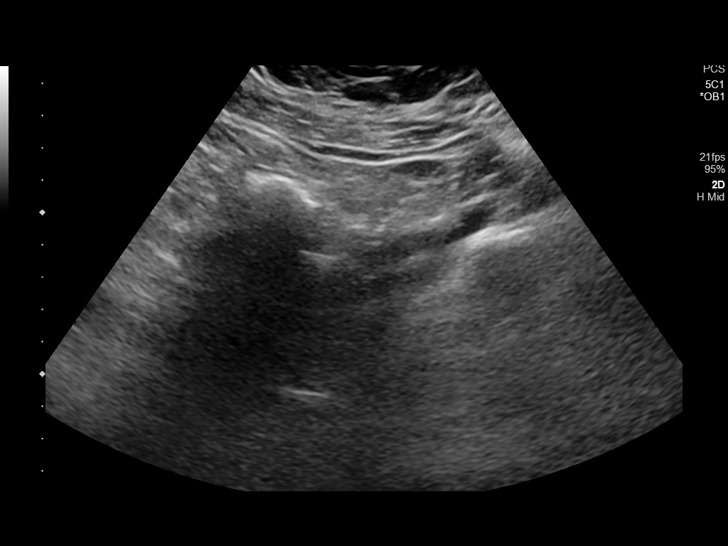
[im 42/103]
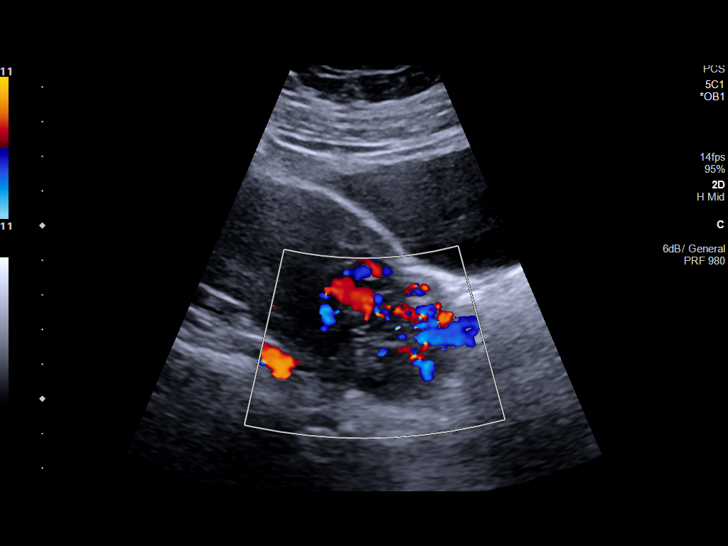
[im 50/103]
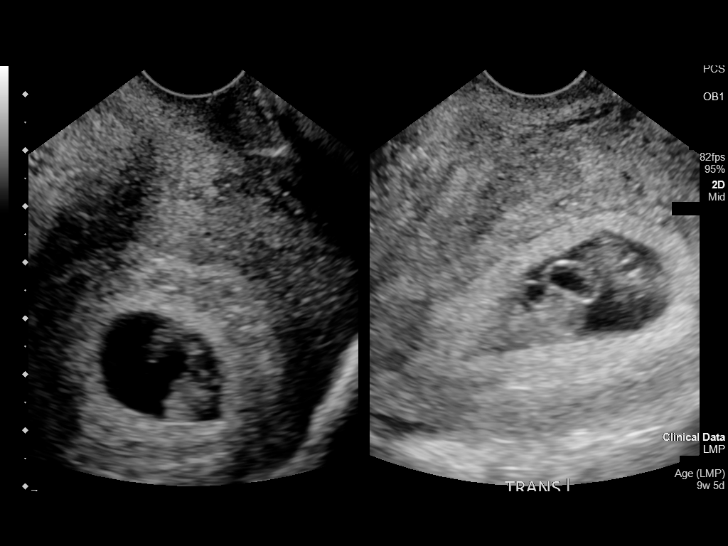
[im 57/103]
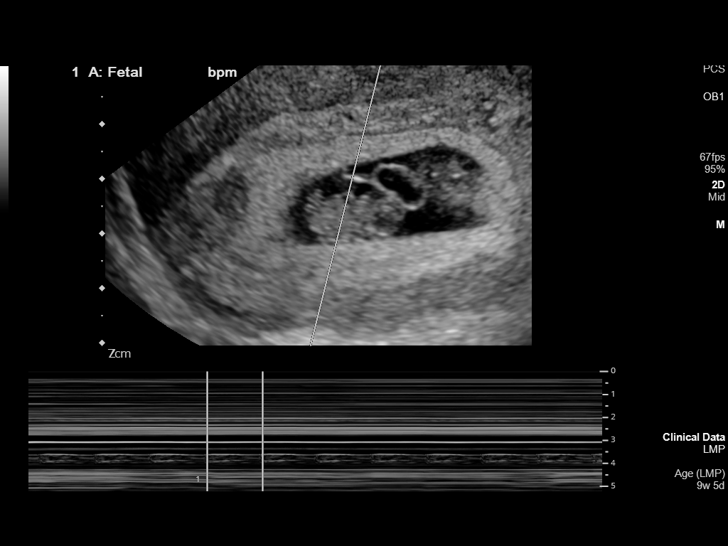
[im 65/103]
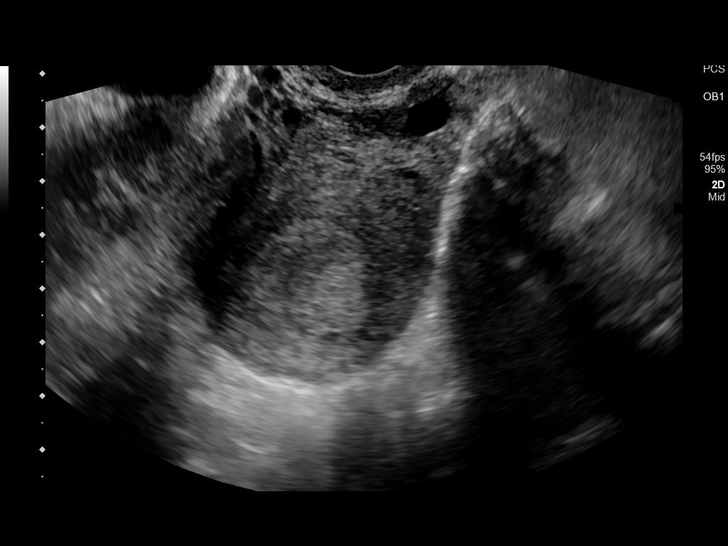
[im 72/103]
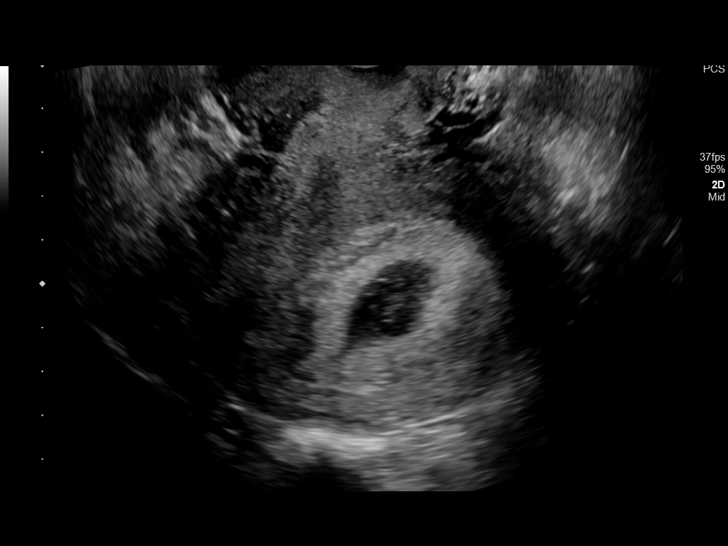
[im 80/103]
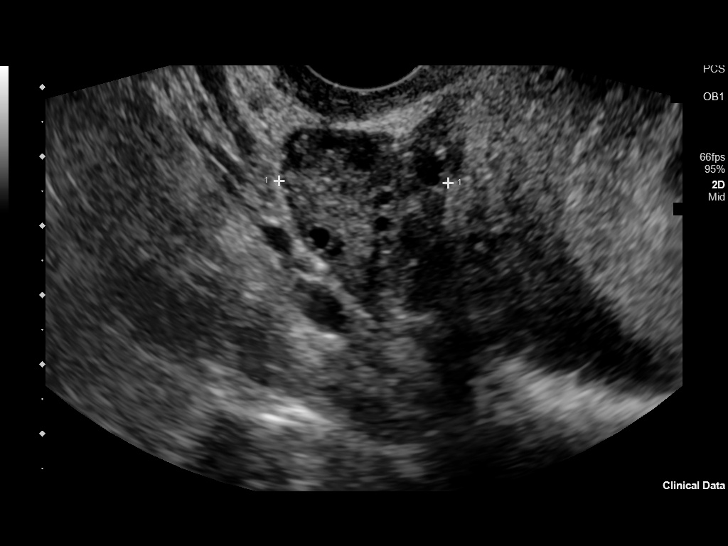
[im 87/103]
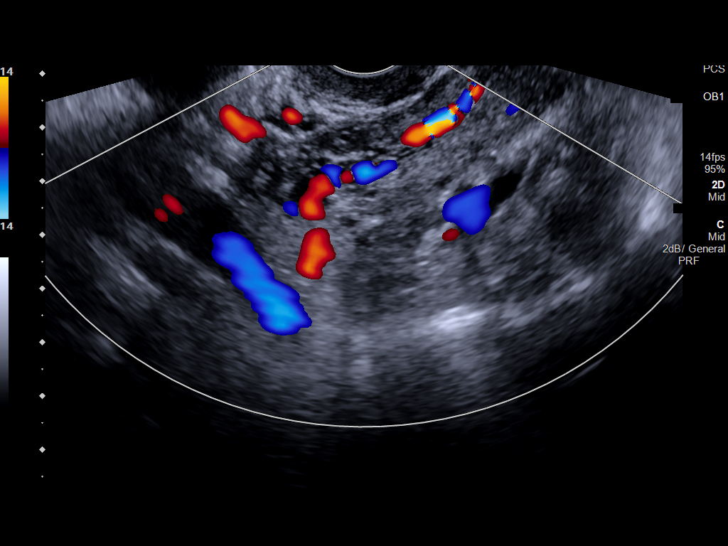
[im 95/103]
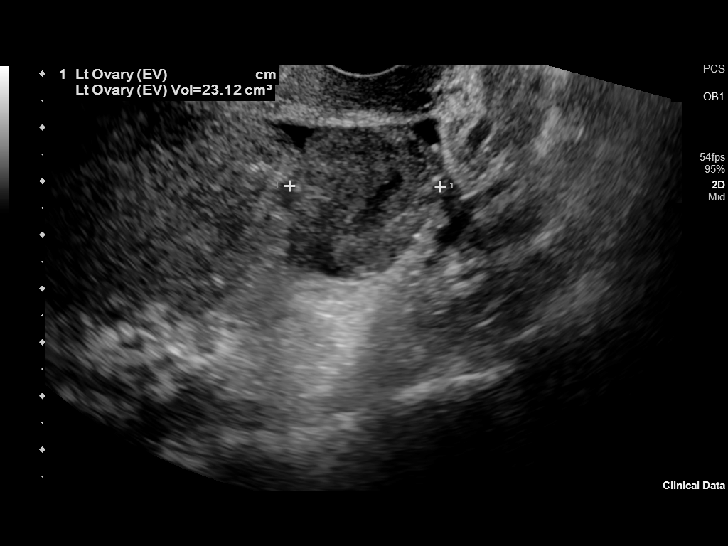
[im 103/103]
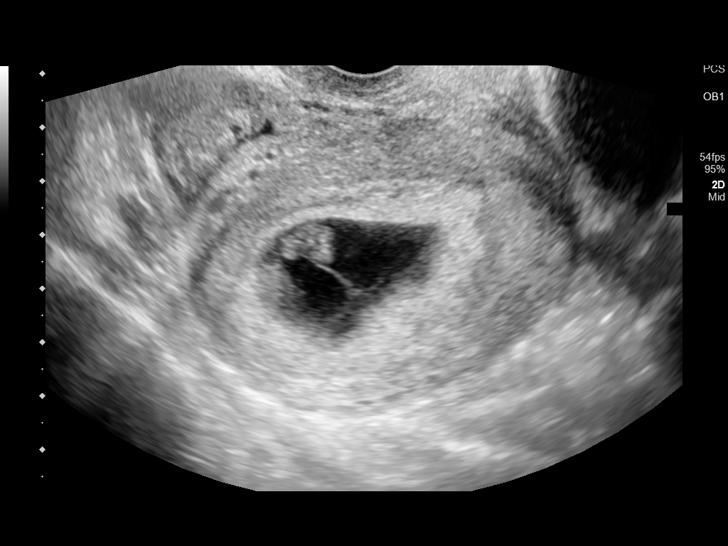

[14 of 28 positions shown; findings below may reference images not displayed]

FINDINGS: Number of IUPs:  2

Chorionicity/Amnionicity:  Questionable mono chorionic/diamniotic

TWIN 1

Yolk sac:  No

Embryo:  Yes

Cardiac Activity: Yes

Heart Rate: 186 bpm

CRL: 20 mm   8 w 4 d                  US EDC: 06/15/2019

TWIN 2

Yolk sac:  Yes

Embryo:  Yes

Cardiac Activity: Yes

Heart Rate: 180 bpm

CRL: 21 mm   8 w 5 d                  US EDC: 06/14/2019

Subchorionic hemorrhage:  None visualized.

Maternal uterus/adnexae: 2.5 cm simple cyst left ovary. No free
fluid.
IMPRESSION: Viable twin pregnancy at 8 weeks 4-5 days.

## 2022-08-08 ENCOUNTER — Encounter: Payer: Self-pay | Admitting: Family Medicine

## 2022-08-17 ENCOUNTER — Other Ambulatory Visit: Payer: Self-pay

## 2022-08-17 DIAGNOSIS — N644 Mastodynia: Secondary | ICD-10-CM

## 2022-08-22 ENCOUNTER — Ambulatory Visit: Payer: Self-pay | Attending: Hematology and Oncology | Admitting: Hematology and Oncology

## 2022-08-22 ENCOUNTER — Ambulatory Visit
Admission: RE | Admit: 2022-08-22 | Discharge: 2022-08-22 | Disposition: A | Discharge status: Home / Self Care | Payer: Self-pay | Source: Ambulatory Visit | Attending: Obstetrics and Gynecology | Admitting: Obstetrics and Gynecology

## 2022-08-22 VITALS — BP 131/89 | Wt 187.7 lb

## 2022-08-22 DIAGNOSIS — N644 Mastodynia: Secondary | ICD-10-CM

## 2022-08-22 NOTE — Progress Notes (Signed)
[name] is a 41 y.o. female who presents to [facility] clinic today with  previous complaints of right breast pain with lump. This has resolved.       Pap Smear: Pap not smear completed today. Last Pap smear was 04/02/2020 at [facility] clinic and was normal. Per patient has no history of an abnormal Pap smear. Last Pap smear result is available in Epic.   Physical exam: Breasts Breasts symmetrical. No skin abnormalities bilateral breasts. No nipple retraction bilateral breasts. No nipple discharge bilateral breasts. No lymphadenopathy. No lumps palpated bilateral breasts.    Pelvic/Bimanual Pap is not indicated today    Smoking History: Patient has never smoked and was not referred to quit line.    Patient Navigation: Patient education provided. Access to services provided for patient through [place] [phone]) [employer] interpreter provided. No transportation provided   Colorectal Cancer Screening: Per patient has never had colonoscopy completed No complaints today.    Breast and Cervical Cancer Risk Assessment: Patient does not have family history of breast cancer, known genetic mutations, or radiation treatment to the chest before age 60. Patient does not have history of cervical dysplasia, immunocompromised, or DES exposure in-utero.  Risk Assessment   No risk assessment data     A: BCCCP exam without pap smear Complaint of right breast pain with lump that has resolved. Benign breast exam.  P: [facility]. Appointment scheduled 08/22/2022.  Miranda Ped, NP 08/22/2022 12:17 PM
